# Patient Record
Sex: Female | Born: 1975 | Race: Black or African American | Hispanic: No | Marital: Single | Smoking: Never smoker
Health system: Southern US, Community
[De-identification: ages and names within clinical notes are randomized; demographics above are authoritative.]

## PROBLEM LIST (undated history)

## (undated) DIAGNOSIS — G43909 Migraine, unspecified, not intractable, without status migrainosus: Secondary | ICD-10-CM

## (undated) DIAGNOSIS — M25561 Pain in right knee: Secondary | ICD-10-CM

## (undated) DIAGNOSIS — N200 Calculus of kidney: Secondary | ICD-10-CM

## (undated) HISTORY — DX: Migraine, unspecified, not intractable, without status migrainosus: G43.909

## (undated) HISTORY — DX: Pain in right knee: M25.561

## (undated) HISTORY — PX: TUBAL LIGATION: SHX77

## (undated) HISTORY — DX: Calculus of kidney: N20.0

---

## 1998-10-06 HISTORY — PX: CHOLECYSTECTOMY: SHX55

## 1998-10-06 HISTORY — PX: KIDNEY STONE SURGERY: SHX686

## 1998-10-06 HISTORY — PX: APPENDECTOMY: SHX54

## 2003-11-29 ENCOUNTER — Emergency Department (HOSPITAL_COMMUNITY): Admission: EM | Admit: 2003-11-29 | Discharge: 2003-11-29 | Payer: Self-pay | Admitting: Family Medicine

## 2007-08-17 ENCOUNTER — Emergency Department (HOSPITAL_COMMUNITY): Admission: EM | Admit: 2007-08-17 | Discharge: 2007-08-17 | Payer: Self-pay | Admitting: Emergency Medicine

## 2007-10-18 ENCOUNTER — Emergency Department (HOSPITAL_COMMUNITY): Admission: EM | Admit: 2007-10-18 | Discharge: 2007-10-18 | Payer: Self-pay | Admitting: Family Medicine

## 2007-11-12 ENCOUNTER — Ambulatory Visit: Payer: Self-pay | Admitting: Internal Medicine

## 2007-11-12 ENCOUNTER — Ambulatory Visit: Payer: Self-pay | Admitting: *Deleted

## 2007-11-12 LAB — CONVERTED CEMR LAB
ALT: 10 units/L (ref 0–35)
AST: 11 units/L (ref 0–37)
Alkaline Phosphatase: 57 units/L (ref 39–117)
BUN: 16 mg/dL (ref 6–23)
Basophils Absolute: 0 10*3/uL (ref 0.0–0.1)
Basophils Relative: 0 % (ref 0–1)
Calcium: 9.1 mg/dL (ref 8.4–10.5)
Chloride: 104 meq/L (ref 96–112)
Creatinine, Ser: 0.82 mg/dL (ref 0.40–1.20)
Eosinophils Absolute: 0.3 10*3/uL (ref 0.0–0.7)
HDL: 54 mg/dL (ref >39)
Hemoglobin: 12.7 g/dL (ref 12.0–15.0)
LDL Cholesterol: 111 mg/dL — ABNORMAL HIGH (ref 0–99)
MCHC: 32.8 g/dL (ref 30.0–36.0)
MCV: 90 fL (ref 78.0–100.0)
Monocytes Absolute: 0.5 10*3/uL (ref 0.1–1.0)
Monocytes Relative: 6 % (ref 3–12)
Neutro Abs: 3.6 10*3/uL (ref 1.7–7.7)
RDW: 13.9 % (ref 11.5–15.5)
Total Bilirubin: 0.4 mg/dL (ref 0.3–1.2)
Total CHOL/HDL Ratio: 3.6
VLDL: 30 mg/dL (ref 0–40)

## 2008-01-10 ENCOUNTER — Ambulatory Visit: Payer: Self-pay | Admitting: Internal Medicine

## 2008-01-10 ENCOUNTER — Encounter: Payer: Self-pay | Admitting: Family Medicine

## 2008-01-10 LAB — CONVERTED CEMR LAB
Chlamydia, DNA Probe: NEGATIVE
GC Probe Amp, Genital: NEGATIVE

## 2008-02-23 ENCOUNTER — Ambulatory Visit: Payer: Self-pay | Admitting: Internal Medicine

## 2008-06-19 ENCOUNTER — Emergency Department (HOSPITAL_COMMUNITY): Admission: EM | Admit: 2008-06-19 | Discharge: 2008-06-19 | Payer: Self-pay | Admitting: Emergency Medicine

## 2009-03-21 ENCOUNTER — Encounter: Payer: Self-pay | Admitting: Family Medicine

## 2009-03-21 ENCOUNTER — Ambulatory Visit: Payer: Self-pay | Admitting: Family Medicine

## 2009-03-26 ENCOUNTER — Emergency Department (HOSPITAL_COMMUNITY): Admission: EM | Admit: 2009-03-26 | Discharge: 2009-03-26 | Payer: Self-pay | Admitting: Family Medicine

## 2009-04-02 ENCOUNTER — Ambulatory Visit: Payer: Self-pay | Admitting: Internal Medicine

## 2009-05-21 ENCOUNTER — Ambulatory Visit: Payer: Self-pay | Admitting: Internal Medicine

## 2009-05-28 ENCOUNTER — Ambulatory Visit: Payer: Self-pay | Admitting: Family Medicine

## 2009-06-04 ENCOUNTER — Ambulatory Visit (HOSPITAL_COMMUNITY): Admission: RE | Admit: 2009-06-04 | Discharge: 2009-06-04 | Payer: Self-pay | Admitting: Family Medicine

## 2010-04-10 ENCOUNTER — Ambulatory Visit: Payer: Self-pay | Admitting: Internal Medicine

## 2010-04-10 LAB — CONVERTED CEMR LAB: CRP: 0.8 mg/dL — ABNORMAL HIGH (ref <0.6)

## 2010-05-02 ENCOUNTER — Ambulatory Visit: Payer: Self-pay | Admitting: Internal Medicine

## 2010-06-07 ENCOUNTER — Emergency Department (HOSPITAL_COMMUNITY): Admission: EM | Admit: 2010-06-07 | Discharge: 2010-06-07 | Payer: Self-pay | Admitting: Emergency Medicine

## 2010-12-27 ENCOUNTER — Other Ambulatory Visit: Payer: Self-pay | Admitting: Advanced Practice Midwife

## 2010-12-27 ENCOUNTER — Encounter: Payer: Self-pay | Admitting: Obstetrics & Gynecology

## 2010-12-27 ENCOUNTER — Encounter (INDEPENDENT_AMBULATORY_CARE_PROVIDER_SITE_OTHER): Payer: Managed Care, Other (non HMO) | Admitting: Advanced Practice Midwife

## 2010-12-27 DIAGNOSIS — N390 Urinary tract infection, site not specified: Secondary | ICD-10-CM

## 2010-12-27 DIAGNOSIS — Z87891 Personal history of nicotine dependence: Secondary | ICD-10-CM

## 2010-12-27 DIAGNOSIS — N3090 Cystitis, unspecified without hematuria: Secondary | ICD-10-CM

## 2010-12-27 LAB — POCT URINALYSIS DIP (DEVICE)
Ketones, ur: NEGATIVE mg/dL
Protein, ur: NEGATIVE mg/dL
Specific Gravity, Urine: >=1.03 (ref 1.005–1.030)
Urobilinogen, UA: 0.2 mg/dL (ref 0.0–1.0)
pH: 5 (ref 5.0–8.0)

## 2010-12-28 ENCOUNTER — Encounter: Payer: Self-pay | Admitting: Obstetrics & Gynecology

## 2010-12-28 LAB — CONVERTED CEMR LAB
WBC, Wet Prep HPF POC: NONE SEEN
Yeast Wet Prep HPF POC: NONE SEEN

## 2010-12-28 NOTE — Progress Notes (Signed)
Tanya Page, Tanya Page               ACCOUNT NO.:  [id number]  MEDICAL RECORD NO.:  [id number]           PATIENT TYPE:  A  LOCATION:  [facility]                   FACILITY:  [facility]  PHYSICIAN:  Ellery Plunk, MD     DATE OF BIRTH:  1975/11/12  DATE OF SERVICE:  12/27/2010                                 CLINIC NOTE  REASON FOR VISIT:  To establish gynecologic care as well as for dysuria. The patient presents today to establish gynecologic care this clinic. She usually goes to [facility] where she had her last Pap smear. However, she had a very positive things about this clinic and wanted to come here.  Her last Pap smear was performed in July 2011 and we have that result here today.  Pap smear was normal; however, did show Trichomonas, which the patient was treated for.  The patient ended her relationship with her partner at that time and has not had any sexual partners since then.  Today, she denies any discharge, itching, or odors.  However, she does note irritation when she urinates, which is intermittent, and this has been going on for the last week.  The patient has no known drug allergies.  CURRENT MEDICATIONS: 1. Flexeril 10 mg. 2. Naproxen 500 mg.  Her last exam was 3 months ago - that her LMP was December 06, 2010.  She began periods at age 46 and they are regular.  She had a tubal ligation 10 years ago for contraception.  She is a G2, P2-0-0-1 with one child dying at the age of 6 years.  The patient has a history of Trichomonas on July 2011 and was treated.  PAST MEDICAL HISTORY:  The patient notes that she has kidney problems, stomach ulcers, and IVF.  PAST SURGICAL HISTORY:  She has never had a blood transfusion.  She had a tubal ligation 10 years ago and her gallbladder and appendix removed in 2002.  SOCIAL HISTORY:  She works at [employer] for [employer].  She lives with her son and her sisters.  She smokes half pack a day for 15 years.  She denies alcohol or  drug abuse.  FAMILY HISTORY:  The patient denies any significant medical history including diabetes, hypertension, cancer.  REVIEW OF SYSTEMS:  The patient reports some headache, muscle or joint pain and hot flashes as well as some frequent urination and some urinary incontinence.  PHYSICAL EXAMINATION:  VITAL SIGNS:  Temperature 98.5, pulse 96, blood pressure 102/71, weight 157.4, and height 66 inches. GENERAL:  She is in no acute distress.  Resting comfortably on exam table. HEENT:  Moist mucous membranes.  No scleral icterus. NECK:  Supple.  No thyromegaly or masses.  No cervical lymphadenopathy. HEART:  Regular rate and rhythm.  No murmur. LUNGS:  Clear to auscultation bilaterally. ABDOMEN:  Soft and nontender.  Bowel sounds positive.  No masses or organomegaly. GYNECOLOGIC:  External exam normal with normal external genitalia.  No lesions.  Vagina is normal with some minimal white discharge.  Normal rugae.  Cervix is normal color with no discharge or lesions.  No cervical motion tenderness.  Uterus appears to be  retroflexed and ovaries are not palpated due to body habitus.  ASSESSMENT AND PLAN:  This is a 35 year old who presents today to establish care with Gynecology as well as for concern for dysuria.  PLAN: 1. Today performed a complete physical exam as well as gynecologic     exam and wet prep was obtained to confirm treatment of Trichomonas.     The patient will be due for her Pap on April 11, 2011.  The patient     was asked to schedule visit for this time for her Pap smear. 2. Dysuria.  The patient's UA today did show positive nitrite, small     leuk esterase, and trace blood.  We will treat this presumed UTI     with Keflex and sent her urine for culture.  The patient is to     return in July 2012 for her annual Pap smear. 3. Primary care.  The patient was encouraged to either return to     [facility] for primary care or find another physician that she     likes.   She is a smoker, and I encouraged her to seek treatment and     workup smoking cessation.          ______________________________ Ellery Plunk, MD    RS/MEDQ  D:  12/27/2010  T:  12/28/2010  Job:  [id number]

## 2011-01-13 LAB — DIFFERENTIAL
Eosinophils Relative: 3 % (ref 0–5)
Lymphocytes Relative: 54 % — ABNORMAL HIGH (ref 12–46)
Lymphs Abs: 5 10*3/uL — ABNORMAL HIGH (ref 0.7–4.0)
Monocytes Relative: 5 % (ref 3–12)
Neutrophils Relative %: 38 % — ABNORMAL LOW (ref 43–77)

## 2011-01-13 LAB — CBC
HCT: 36.3 % (ref 36.0–46.0)
MCV: 90.9 fL (ref 78.0–100.0)
Platelets: 301 10*3/uL (ref 150–400)
RBC: 3.99 MIL/uL (ref 3.87–5.11)
WBC: 9.2 10*3/uL (ref 4.0–10.5)

## 2011-01-13 LAB — D-DIMER, QUANTITATIVE: D-Dimer, Quant: <0.22 ug/mL-FEU (ref 0.00–0.48)

## 2011-01-24 ENCOUNTER — Emergency Department (HOSPITAL_COMMUNITY)
Admission: EM | Admit: 2011-01-24 | Discharge: 2011-01-24 | Disposition: A | Discharge status: Home / Self Care | Payer: Managed Care, Other (non HMO) | Attending: Emergency Medicine | Admitting: Emergency Medicine

## 2011-01-24 DIAGNOSIS — T192XXA Foreign body in vulva and vagina, initial encounter: Secondary | ICD-10-CM | POA: Insufficient documentation

## 2011-01-24 DIAGNOSIS — Y929 Unspecified place or not applicable: Secondary | ICD-10-CM | POA: Insufficient documentation

## 2011-01-24 DIAGNOSIS — IMO0002 Reserved for concepts with insufficient information to code with codable children: Secondary | ICD-10-CM | POA: Insufficient documentation

## 2011-06-01 ENCOUNTER — Emergency Department (HOSPITAL_COMMUNITY)
Admission: EM | Admit: 2011-06-01 | Discharge: 2011-06-01 | Disposition: A | Discharge status: Home / Self Care | Payer: Managed Care, Other (non HMO) | Attending: Emergency Medicine | Admitting: Emergency Medicine

## 2011-06-01 DIAGNOSIS — G43909 Migraine, unspecified, not intractable, without status migrainosus: Secondary | ICD-10-CM | POA: Insufficient documentation

## 2011-06-01 DIAGNOSIS — M25569 Pain in unspecified knee: Secondary | ICD-10-CM | POA: Insufficient documentation

## 2011-06-13 ENCOUNTER — Other Ambulatory Visit (HOSPITAL_COMMUNITY)
Admission: RE | Admit: 2011-06-13 | Discharge: 2011-06-13 | Disposition: A | Discharge status: Home / Self Care | Payer: Managed Care, Other (non HMO) | Source: Ambulatory Visit | Attending: Family Medicine | Admitting: Family Medicine

## 2011-06-13 ENCOUNTER — Ambulatory Visit (INDEPENDENT_AMBULATORY_CARE_PROVIDER_SITE_OTHER): Payer: Managed Care, Other (non HMO) | Admitting: Family Medicine

## 2011-06-13 ENCOUNTER — Encounter: Payer: Self-pay | Admitting: Family Medicine

## 2011-06-13 VITALS — BP 120/80 | HR 72 | Temp 98.2°F | Ht 65.75 in | Wt 171.0 lb

## 2011-06-13 DIAGNOSIS — Z9851 Tubal ligation status: Secondary | ICD-10-CM

## 2011-06-13 DIAGNOSIS — G5600 Carpal tunnel syndrome, unspecified upper limb: Secondary | ICD-10-CM

## 2011-06-13 DIAGNOSIS — G43909 Migraine, unspecified, not intractable, without status migrainosus: Secondary | ICD-10-CM

## 2011-06-13 DIAGNOSIS — Z0000 Encounter for general adult medical examination without abnormal findings: Secondary | ICD-10-CM

## 2011-06-13 DIAGNOSIS — Z01419 Encounter for gynecological examination (general) (routine) without abnormal findings: Secondary | ICD-10-CM | POA: Insufficient documentation

## 2011-06-13 LAB — TSH: TSH: 1.1 u[IU]/mL (ref 0.35–5.50)

## 2011-06-13 LAB — POCT URINALYSIS DIPSTICK
Bilirubin, UA: NEGATIVE
Glucose, UA: NEGATIVE
Ketones, UA: NEGATIVE
Leukocytes, UA: NEGATIVE
Spec Grav, UA: 1.02

## 2011-06-13 LAB — CBC WITH DIFFERENTIAL/PLATELET
Basophils Absolute: 0 10*3/uL (ref 0.0–0.1)
Basophils Relative: 0.3 % (ref 0.0–3.0)
Eosinophils Relative: 5.6 % — ABNORMAL HIGH (ref 0.0–5.0)
Hemoglobin: 12.1 g/dL (ref 12.0–15.0)
Lymphocytes Relative: 52 % — ABNORMAL HIGH (ref 12.0–46.0)
Monocytes Relative: 6.3 % (ref 3.0–12.0)
Neutro Abs: 2.1 10*3/uL (ref 1.4–7.7)
RBC: 4.08 Mil/uL (ref 3.87–5.11)
RDW: 13.1 % (ref 11.5–14.6)
WBC: 5.9 10*3/uL (ref 4.5–10.5)

## 2011-06-13 LAB — LIPID PANEL
Cholesterol: 201 mg/dL — ABNORMAL HIGH (ref 0–200)
HDL: 54.3 mg/dL (ref >39.00)
Total CHOL/HDL Ratio: 4
VLDL: 14.6 mg/dL (ref 0.0–40.0)

## 2011-06-13 LAB — BASIC METABOLIC PANEL
BUN: 16 mg/dL (ref 6–23)
Creatinine, Ser: 0.8 mg/dL (ref 0.4–1.2)
GFR: 111.95 mL/min (ref >60.00)

## 2011-06-13 LAB — HEPATIC FUNCTION PANEL
Bilirubin, Direct: 0 mg/dL (ref 0.0–0.3)
Total Bilirubin: 0.8 mg/dL (ref 0.3–1.2)

## 2011-06-13 MED ORDER — TEMAZEPAM 30 MG PO CAPS: 30.0000 mg | ORAL_CAPSULE | Freq: Every evening | ORAL | Status: AC | PRN

## 2011-06-13 MED ORDER — RIZATRIPTAN BENZOATE 10 MG PO TABS: 10.0000 mg | ORAL_TABLET | Freq: Once | ORAL | Status: DC | PRN

## 2011-06-13 NOTE — Progress Notes (Signed)
Subjective:    Patient ID: Tanya Page, female    DOB: 12-09-1975, 35 y.o.   MRN: [id number]  HPI 35 yr old female to establish with [place] and for a cpx. She moved to [place] from [place], [place] about a year ago. Her last cpx and Pap were done in June 2011. She has several issues to discuss. First she fell almost a year ago and injured her right knee, and it sounds like she has been diagnosed with patellofemoral syndrome. Her patella subluxates laterally frequently and causes her a lot of pain. She has had 3 months of PT, been fitted with braces, and has had several cortisone injections, but nothing seems to help much. She is seeing [facility] for this. She also has migraine HAs frequently, often having 3-4 a month. She has severe HAs about 3-4 times a year. She has tried Imitrex shots twice, but both times she had chest pains afterwards, and she has never tried any type of oral triptan since then. She uses Excedrin Migraine or Advil and goes to bed with the more severe HAs. She gets nauseated but does not vomit with them. Third, she mentions occasional swelling and numbness in the right hand, but no pain. This started about 3 months ago. 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 Last, she had a tubal ligation in 2000 in [place], [place], and she asks about the possibility of having this reversed.   Review of Systems  Constitutional: Negative.  Negative for fever, diaphoresis, activity change, appetite change, fatigue and unexpected weight change.  HENT: Negative for hearing loss, ear pain, nosebleeds, congestion, sore throat, trouble swallowing, neck pain, neck stiffness, voice change and tinnitus.   Eyes: Negative.  Negative for photophobia, pain, discharge, redness and visual disturbance.  Respiratory: Negative.  Negative for apnea, cough, choking, chest tightness, shortness of breath, wheezing and stridor.   Cardiovascular: Negative.  Negative for chest pain,  palpitations and leg swelling.  Gastrointestinal: Negative.  Negative for nausea, vomiting, abdominal pain, diarrhea, constipation, blood in stool, abdominal distention and rectal pain.  Genitourinary: Negative.  Negative for dysuria, urgency, frequency, hematuria, flank pain, vaginal bleeding, vaginal discharge, enuresis, difficulty urinating, vaginal pain and menstrual problem.  Musculoskeletal: Negative.  Negative for myalgias, back pain, joint swelling, arthralgias and gait problem.  Skin: Negative.  Negative for color change, pallor, rash and wound.  Neurological: Positive for numbness and headaches. Negative for dizziness, tremors, seizures, syncope, speech difficulty, weakness and light-headedness.  Hematological: Negative.  Negative for adenopathy. Does not bruise/bleed easily.  Psychiatric/Behavioral: Negative.  Negative for hallucinations, behavioral problems, confusion, sleep disturbance, dysphoric mood and agitation. The patient is not nervous/anxious.        Objective:   Physical Exam  Constitutional: She appears well-developed and well-nourished. No distress.  HENT:  Head: Normocephalic and atraumatic.  Right Ear: External ear normal.  Left Ear: External ear normal.  Nose: Nose normal.  Mouth/Throat: Oropharynx is clear and moist. No oropharyngeal exudate.  Eyes: Conjunctivae and EOM are normal. Pupils are equal, round, and reactive to light. Right eye exhibits no discharge. Left eye exhibits no discharge. No scleral icterus.  Neck: Normal range of motion. Neck supple. No JVD present. No thyromegaly present.  Cardiovascular: Normal rate, regular rhythm, normal heart sounds and intact distal pulses.  Exam reveals no gallop and no friction rub.   No murmur heard. Pulmonary/Chest: Effort normal and breath sounds normal. No stridor. No respiratory distress. She has no wheezes.  She has no rales. She exhibits no tenderness.  Abdominal: Soft. Normal appearance and bowel sounds are  normal. She exhibits no distension, no abdominal bruit, no ascites and no mass. There is no hepatosplenomegaly. There is no tenderness. There is no rigidity, no rebound and no guarding. No hernia.  Genitourinary: Rectum normal, vagina normal and uterus normal. No breast swelling, tenderness, discharge or bleeding. Cervix exhibits no motion tenderness, no discharge and no friability. Right adnexum displays no mass, no tenderness and no fullness. Left adnexum displays no mass, no tenderness and no fullness. No erythema, tenderness or bleeding around the vagina. No vaginal discharge found.  Musculoskeletal: Normal range of motion. She exhibits no edema and no tenderness.  Lymphadenopathy:    She has no cervical adenopathy.  Neurological: She is alert. She has normal reflexes. No cranial nerve deficit. She exhibits normal muscle tone. Coordination normal.  Skin: Skin is warm and dry. No rash noted. She is not diaphoretic. No erythema. No pallor.  Psychiatric: She has a normal mood and affect. Her behavior is normal. Judgment and thought content normal.          Assessment & Plan:  Well exam. Advised her to set up her first baseline mammogram soon. Try Maxalt for her migraines. Wear a wrist splint for the right hand. We will refer her to GYN to discuss a tubal reversal

## 2011-06-18 ENCOUNTER — Telehealth: Payer: Self-pay | Admitting: Family Medicine

## 2011-06-18 NOTE — Telephone Encounter (Signed)
Message copied by Baldemar Friday on Wed Jun 18, 2011  5:29 PM ------      Message from: Gershon Crane A      Created: Tue Jun 17, 2011  5:25 AM       Normal except high chol. Watch the diet

## 2011-06-18 NOTE — Telephone Encounter (Signed)
Spoke with pt and gave results. 

## 2011-06-25 NOTE — Progress Notes (Signed)
Quick Note:  Pt aware ______ 

## 2011-06-25 NOTE — Progress Notes (Signed)
Quick Note:  Left a message for pt to return call. ______ 

## 2011-07-11 ENCOUNTER — Ambulatory Visit (HOSPITAL_BASED_OUTPATIENT_CLINIC_OR_DEPARTMENT_OTHER)
Admission: RE | Admit: 2011-07-11 | Discharge: 2011-07-11 | Disposition: A | Discharge status: Home / Self Care | Payer: Managed Care, Other (non HMO) | Source: Ambulatory Visit | Attending: Orthopedic Surgery | Admitting: Orthopedic Surgery

## 2011-07-11 DIAGNOSIS — M6750 Plica syndrome, unspecified knee: Secondary | ICD-10-CM | POA: Insufficient documentation

## 2011-07-11 DIAGNOSIS — M2240 Chondromalacia patellae, unspecified knee: Secondary | ICD-10-CM | POA: Insufficient documentation

## 2011-07-11 DIAGNOSIS — Z01812 Encounter for preprocedural laboratory examination: Secondary | ICD-10-CM | POA: Insufficient documentation

## 2011-07-11 DIAGNOSIS — M6240 Contracture of muscle, unspecified site: Secondary | ICD-10-CM | POA: Insufficient documentation

## 2011-07-11 HISTORY — PX: KNEE ARTHROSCOPY: SUR90

## 2011-07-14 NOTE — Op Note (Signed)
NAMEJERUSALEN, Tanya Page               ACCOUNT NO.:  [id number]  MEDICAL RECORD NO.:  [id number]  LOCATION:                                 FACILITY:  PHYSICIAN:  Harvie Junior, M.D.   DATE OF BIRTH:  [birth year]  DATE OF PROCEDURE:  07/11/2011 DATE OF DISCHARGE:                              OPERATIVE REPORT   PREOPERATIVE DIAGNOSIS:  Right knee pain with chondromalacia patella.  POSTOPERATIVE DIAGNOSES: 1. 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 2. Medial shelf plica.  PROCEDURE: 1. Arthroscopic lateral retinacular release. 2. Arthroscopic debridement of medial plica coming all the way down to     the anterior area.  SURGEON:  Harvie Junior, MD  ASSISTANT:  Marshia Ly, PA  ANESTHESIA:  General.  BRIEF HISTORY:  Tanya Page is a 35 year old female with a long history of significant right knee pain.  We treated conservatively for a long period of time.  She failed activity modification, injection therapy, anti-inflammatory medication because of failure of all conservative care.  She was also taken to the operating room for operative knee arthroscopy, suspected that most of her pain was patellofemoral in origin preoperatively, and we felt that we wanted to take a good look at patellar tracking and give consideration to lateral retinacular release if her tracking seemed to be not midline.  She was brought to the operating room for this procedure.  PROCEDURE IN DETAIL:  The patient was brought to the operating room. After adequate anesthesia was obtained with general anesthetic, the patient was placed supine on the operating table.  The right leg was prepped and draped in usual sterile fashion.  Following this routine arthroscopic examination of the knee revealed there was an obvious lateral patellar tracking with both lateral tilt and lateral translation, went to the medial side.  There was a large medial shelf plica, which  connected around anteriorly to a sort of an anterior plica within the medial compartment, medial meniscus normal, medial femoral condyle normal, ACL normal, lateral side.  No little bit of an outer rim of meniscal issue, but certainly nothing that needed to be debrided and lateral femoral condyle normal.  Attention turned back up to the lateral retinaculum, which was densely tight and a lateral retinacular release was performed from the three fingerbreadths proximal to the patella down to the joint line.  Probe was used.  Once this done to make sure, we did in fact released entirely the lateral retinaculum.  At this time, the lateral patellar tracking completely resolved itself in terms of its tilt and once that was completed, attention turned medially where this large medial plica was debrided from the medial side down around anteriorly and into the notch.  Once this was completed, the knee was again examined mediolaterally ACL and up into the patellofemoral joint. No evidence of a looser fragment and pieces and at this point, the knee was copiously and thoroughly lavaged, suctioned dry.  All arthroscopic portals were closed with bandage.  Sterile compression dressing was applied.  The patient was taken to the recovery room.  She was noted to be in satisfactory condition.  Estimated  blood loss for this procedure was none.     Harvie Junior, M.D.     Ranae Plumber  D:  07/11/2011  T:  07/12/2011  Job:  [id number]  Electronically Signed by Jodi Geralds M.D. on 07/14/2011 05:15:19 PM

## 2011-08-11 ENCOUNTER — Telehealth: Payer: Self-pay | Admitting: *Deleted

## 2011-08-11 NOTE — Telephone Encounter (Signed)
Since having her CPX , she has had one period that has been 2 weeks long??  Suggestions?

## 2011-08-12 ENCOUNTER — Telehealth: Payer: Self-pay | Admitting: Family Medicine

## 2011-08-12 NOTE — Telephone Encounter (Signed)
I spoke with pt and she is going to schedule a office visit for this week.

## 2011-08-12 NOTE — Telephone Encounter (Signed)
I do not think she needs to worry about this. Besides I thought she was going to see GYN anyway about a tubal reversal. She could ask them if the periods are still like this

## 2011-08-12 NOTE — Telephone Encounter (Signed)
Pt called back and stated that she never scheduled a GYN appt. She stated that she had changed her mind about having the tubal reversal. Had knee surgery on 07-11-2011 and period started 07-26-2011, and as of today she is still bleeding. Please advise. Thanks. Please call patient at [phone].  [facility].

## 2011-08-13 ENCOUNTER — Ambulatory Visit (INDEPENDENT_AMBULATORY_CARE_PROVIDER_SITE_OTHER): Payer: Managed Care, Other (non HMO) | Admitting: Family Medicine

## 2011-08-13 ENCOUNTER — Encounter: Payer: Self-pay | Admitting: Family Medicine

## 2011-08-13 VITALS — BP 112/76 | HR 90 | Temp 98.3°F | Wt 175.0 lb

## 2011-08-13 DIAGNOSIS — N921 Excessive and frequent menstruation with irregular cycle: Secondary | ICD-10-CM

## 2011-08-13 NOTE — Progress Notes (Signed)
  Subjective:    Patient ID: Tanya Page, female    DOB: 10-19-1975, 35 y.o.   MRN: [id number]  HPI Here for heavy menses for the past few months. She was here on 06-13-11 for a cpx, and her pelvic exam and Pap smear were normal. Her menses had been unremarkable to that point. However in September she had bleeding for 2 weeks, and then in October she had bleeding for 3 weeks. Her cramps are light. No other problems. No recent medication changes. She has had a tubal ligation.    Review of Systems  Constitutional: Negative.   Gastrointestinal: Negative.   Genitourinary: Positive for menstrual problem.       Objective:   Physical Exam  Constitutional: She appears well-developed and well-nourished.  Abdominal: Soft. Bowel sounds are normal. She exhibits no distension and no mass. There is no tenderness. There is no rebound and no guarding.          Assessment & Plan:  Get a pelvic US to look at her endometrial lining, and we will go from there

## 2011-08-14 NOTE — Progress Notes (Signed)
Addended by: Gershon Crane A on: 08/14/2011 09:34 AM   Modules accepted: Orders

## 2011-08-15 ENCOUNTER — Ambulatory Visit: Payer: Managed Care, Other (non HMO) | Admitting: Family Medicine

## 2011-08-19 ENCOUNTER — Ambulatory Visit
Admission: RE | Admit: 2011-08-19 | Discharge: 2011-08-19 | Disposition: A | Discharge status: Home / Self Care | Payer: Managed Care, Other (non HMO) | Source: Ambulatory Visit | Attending: Family Medicine | Admitting: Family Medicine

## 2011-08-19 DIAGNOSIS — N921 Excessive and frequent menstruation with irregular cycle: Secondary | ICD-10-CM

## 2011-08-20 ENCOUNTER — Telehealth: Payer: Self-pay | Admitting: Family Medicine

## 2011-08-20 DIAGNOSIS — N921 Excessive and frequent menstruation with irregular cycle: Secondary | ICD-10-CM

## 2011-08-20 NOTE — Progress Notes (Signed)
Quick Note:  Spoke with pt and she does want the referral, no one in particular. ______

## 2011-08-20 NOTE — Telephone Encounter (Signed)
Pt does want the referral for the GYN, no one in particular.

## 2011-08-21 NOTE — Telephone Encounter (Signed)
Pt is aware of referral

## 2011-08-21 NOTE — Telephone Encounter (Signed)
The referral is done. Camelia Eng will call her

## 2011-09-01 ENCOUNTER — Encounter (HOSPITAL_COMMUNITY): Payer: Self-pay | Admitting: *Deleted

## 2011-09-01 ENCOUNTER — Inpatient Hospital Stay (HOSPITAL_COMMUNITY)
Admission: AD | Admit: 2011-09-01 | Discharge: 2011-09-01 | Disposition: A | Discharge status: Home / Self Care | Payer: Managed Care, Other (non HMO) | Source: Ambulatory Visit | Attending: Obstetrics & Gynecology | Admitting: Obstetrics & Gynecology

## 2011-09-01 DIAGNOSIS — N938 Other specified abnormal uterine and vaginal bleeding: Secondary | ICD-10-CM | POA: Insufficient documentation

## 2011-09-01 DIAGNOSIS — N949 Unspecified condition associated with female genital organs and menstrual cycle: Secondary | ICD-10-CM | POA: Insufficient documentation

## 2011-09-01 LAB — CBC
HCT: 34.3 % — ABNORMAL LOW (ref 36.0–46.0)
MCV: 89.3 fL (ref 78.0–100.0)
Platelets: 323 10*3/uL (ref 150–400)
RBC: 3.84 MIL/uL — ABNORMAL LOW (ref 3.87–5.11)
WBC: 8.3 10*3/uL (ref 4.0–10.5)

## 2011-09-01 LAB — URINALYSIS, ROUTINE W REFLEX MICROSCOPIC
Bilirubin Urine: NEGATIVE
Glucose, UA: NEGATIVE mg/dL
Nitrite: NEGATIVE
Specific Gravity, Urine: >1.03 — ABNORMAL HIGH (ref 1.005–1.030)
pH: 5.5 (ref 5.0–8.0)

## 2011-09-01 LAB — BASIC METABOLIC PANEL
CO2: 25 mEq/L (ref 19–32)
Chloride: 104 mEq/L (ref 96–112)
Creatinine, Ser: 0.81 mg/dL (ref 0.50–1.10)

## 2011-09-01 LAB — URINE MICROSCOPIC-ADD ON

## 2011-09-01 MED ORDER — MEDROXYPROGESTERONE ACETATE 10 MG PO TABS: 20.0000 mg | ORAL_TABLET | Freq: Every day | ORAL | Status: DC

## 2011-09-01 NOTE — ED Provider Notes (Signed)
History   Chief Complaint:  Vaginal Bleeding   Tanya Page is  35 y.o. G2P1101 Patient's last menstrual period was 07/26/2011. Her pregnancy status is negative.   She reports vag bleeding since 07/26/2011. Pt having dizziness, blurry vision and cramping.  She uses about 6 pads/ day.     Pertinent Gynecological History: Menses: flow is excessive with use of 6 pads or tampons on heaviest days Bleeding: dysfunctional uterine bleeding Contraception: none DES exposure: denies Blood transfusions: none Last pap: normal Date:06/13/11   Past Medical History  Diagnosis Date  . Migraines   . Nephrolithiasis   . Right knee pain     Past Surgical History  Procedure Date  . Tubal ligation   . Appendectomy 2000  . Cholecystectomy 2000  . Kidney stone surgery 2000  . Knee arthroscopy 07-11-11    right knee, per Dr. Milly Jakob     Family History  Problem Relation Age of Onset  . Hypertension Mother     History  Substance Use Topics  . Smoking status: Never Smoker   . Smokeless tobacco: Never Used  . Alcohol Use: 0.6 oz/week    1 Glasses of wine per week    Allergies:  Allergies  Allergen Reactions  . Imitrex (Sumatriptan Base)     Shortness of breath and chest tightness  . Percocet (Oxycodone-Acetaminophen)     Prescriptions prior to admission  Medication Sig Dispense Refill  . amitriptyline (ELAVIL) 25 MG tablet Take 25 mg by mouth at bedtime.        Marland Kitchen HYDROcodone-acetaminophen (NORCO) 5-325 MG per tablet Take 1 tablet by mouth every 6 (six) hours as needed.        . indomethacin (INDOCIN SR) 75 MG CR capsule Take 75 mg by mouth 2 (two) times daily with a meal.        . lidocaine (LIDODERM) 5 % Place 1 patch onto the skin every 12 (twelve) hours. Remove & Discard patch within 12 hours or as directed by MD       . meloxicam (MOBIC) 15 MG tablet Take 15 mg by mouth daily.        . promethazine (PHENERGAN) 12.5 MG tablet Take 12.5 mg by mouth every 6 (six) hours as  needed.           Physical Exam   Blood pressure 126/87, pulse 87, temperature 97.5 F (36.4 C), resp. rate 16, height 5\' 6"  (1.676 m), weight 80.831 kg (178 lb 3.2 oz), last menstrual period 07/26/2011, SpO2 96.00%. General: Mental status - alert, oriented to person, place, and time Abdomen - soft, nontender, nondistended, no masses or organomegaly Pelvic - examination not indicated  Labs: Recent Results (from the past 24 hour(s))  URINALYSIS, ROUTINE W REFLEX MICROSCOPIC   Collection Time   09/01/11  8:45 PM      Component Value Range   Color, Urine YELLOW  YELLOW    Appearance CLEAR  CLEAR    Specific Gravity, Urine >1.030 (*) 1.005 - 1.030    pH 5.5  5.0 - 8.0    Glucose, UA NEGATIVE  NEGATIVE (mg/dL)   Hgb urine dipstick LARGE (*) NEGATIVE    Bilirubin Urine NEGATIVE  NEGATIVE    Ketones, ur 15 (*) NEGATIVE (mg/dL)   Protein, ur NEGATIVE  NEGATIVE (mg/dL)   Urobilinogen, UA 0.2  0.0 - 1.0 (mg/dL)   Nitrite NEGATIVE  NEGATIVE    Leukocytes, UA NEGATIVE  NEGATIVE   URINE MICROSCOPIC-ADD ON  Collection Time   09/01/11  8:45 PM      Component Value Range   Squamous Epithelial / LPF RARE  RARE    WBC, UA 0-2  <3 (WBC/hpf)   RBC / HPF 3-6  <3 (RBC/hpf)   Bacteria, UA FEW (*) RARE   POCT PREGNANCY, URINE   Collection Time   09/01/11  9:04 PM      Component Value Range   Preg Test, Ur NEGATIVE    CBC   Collection Time   09/01/11  9:41 PM      Component Value Range   WBC 8.3  4.0 - 10.5 (K/uL)   RBC 3.84 (*) 3.87 - 5.11 (MIL/uL)   Hemoglobin 11.4 (*) 12.0 - 15.0 (g/dL)   HCT 16.1 (*) 09.6 - 46.0 (%)   MCV 89.3  78.0 - 100.0 (fL)   MCH 29.7  26.0 - 34.0 (pg)   MCHC 33.2  30.0 - 36.0 (g/dL)   RDW 04.5  40.9 - 81.1 (%)   Platelets 323  150 - 400 (K/uL)    Ultrasound Studies US Pelvis Complete  08/19/2011  *RADIOLOGY REPORT*  Clinical Data: Menorrhagia, pelvic pain  TRANSABDOMINAL AND TRANSVAGINAL ULTRASOUND OF PELVIS Technique:  Both transabdominal and  transvaginal ultrasound examinations of the pelvis were performed. Transabdominal technique was performed for global imaging of the pelvis including uterus, ovaries, adnexal regions, and pelvic cul-de-sac.  Comparison: None.   It was necessary to proceed with endovaginal exam following the transabdominal exam to visualize the endometrium and non-visualized right ovary.  Findings:  Uterus: 9.7 x 4.9 x 4.7 cm.  Anteverted, anteflexed.  No focal abnormality.  Endometrium: 5 mm, inhomogeneous, with suggestion of a focus of internal color flow but no measurable abnormality.  Right ovary:  3.4 x 2.3 x 1.4 cm.  Normal.  Left ovary: 4.3 x 4.4 x 3.1 cm.  Normal.  Other findings: No free fluid  IMPRESSION: Mildly inhomogeneous endometrium without measurable thickening, although there is suggestion of internal color flow that could suggest an underlying polyp, submucosal fibroid, or less likely hyperplasia/neoplasia.  Consider sonohysterography for further evaluation if symptoms persist.  No acute abnormality.  Original Report Authenticated By: Harrel Lemon, M.D.   Assessment: There is no problem list on file for this patient. Dysfunctional uterine bleeding Irregular endometrial lining  Plan: Needs to be seen in GYN clinic.  Provera Rx given; 20 mg po daily. Needs endometrial biopsy and discussion about medical vs surgical (polypectomy, endometrial ablation) Bleeding precautions reviewed.  Discharge Medications: Current Discharge Medication List    CONTINUE these medications which have NOT CHANGED   Details  amitriptyline (ELAVIL) 25 MG tablet Take 25 mg by mouth at bedtime.      HYDROcodone-acetaminophen (NORCO) 5-325 MG per tablet Take 1 tablet by mouth every 6 (six) hours as needed.      indomethacin (INDOCIN SR) 75 MG CR capsule Take 75 mg by mouth 2 (two) times daily with a meal.      lidocaine (LIDODERM) 5 % Place 1 patch onto the skin every 12 (twelve) hours. Remove & Discard patch within 12  hours or as directed by MD     meloxicam (MOBIC) 15 MG tablet Take 15 mg by mouth daily.      promethazine (PHENERGAN) 12.5 MG tablet Take 12.5 mg by mouth every 6 (six) hours as needed.           Jedadiah Abdallah A 09/01/2011, 10:10 PM

## 2011-09-01 NOTE — Progress Notes (Signed)
Pt reports vag bleeding since 07/26/2011.  Pt having dizziness, blurry vision and cramping.

## 2011-12-25 IMAGING — US US PELVIS COMPLETE
1 series · 14 of 25 positions shown · non-contrast
Comparison: None.

CLINICAL DATA: Menorrhagia, pelvic pain

TRANSABDOMINAL AND TRANSVAGINAL ULTRASOUND OF PELVIS
TECHNIQUE: Both transabdominal and transvaginal ultrasound
examinations of the pelvis were performed. Transabdominal technique
was performed for global imaging of the pelvis including uterus,
ovaries, adnexal regions, and pelvic cul-de-sac.

[Series 1: us pelvis complete · 0.33mm/px · 14 of 67 slices shown]
[im 1/67]
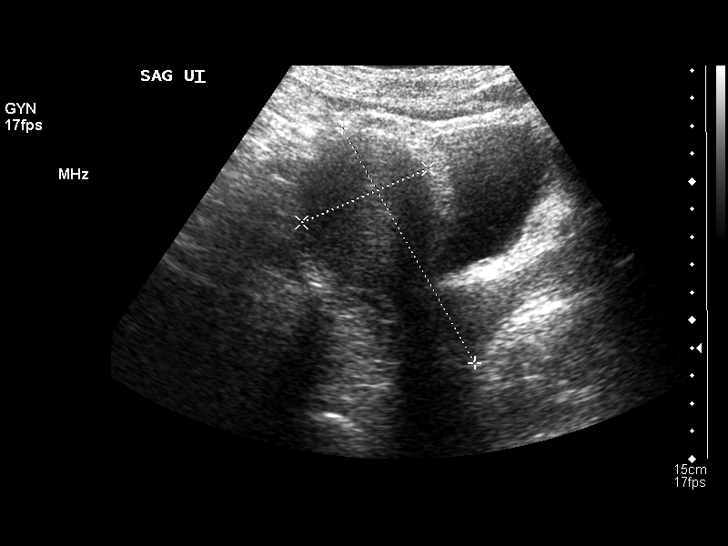
[im 6/67]
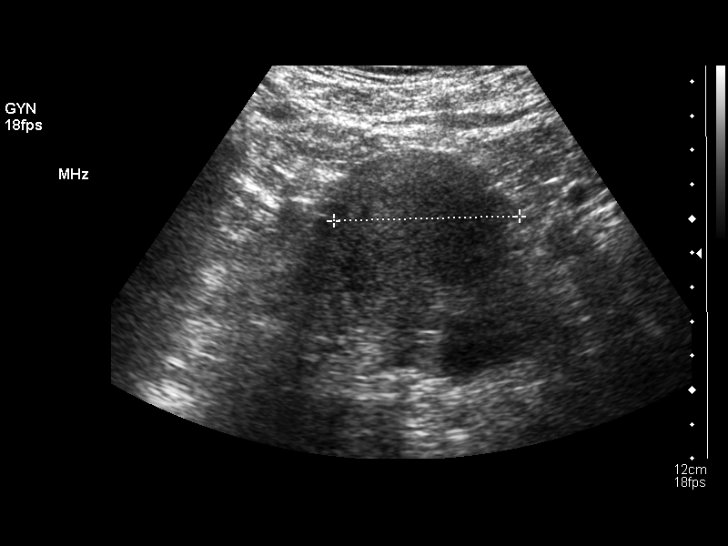
[im 12/67]
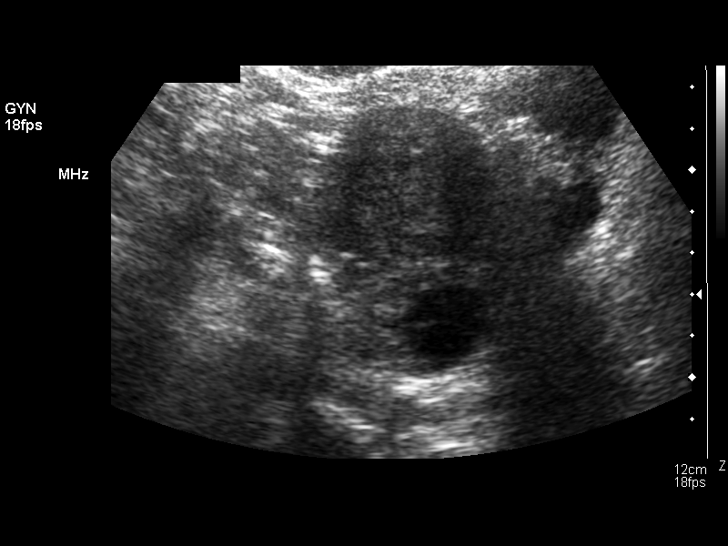
[im 17/67]
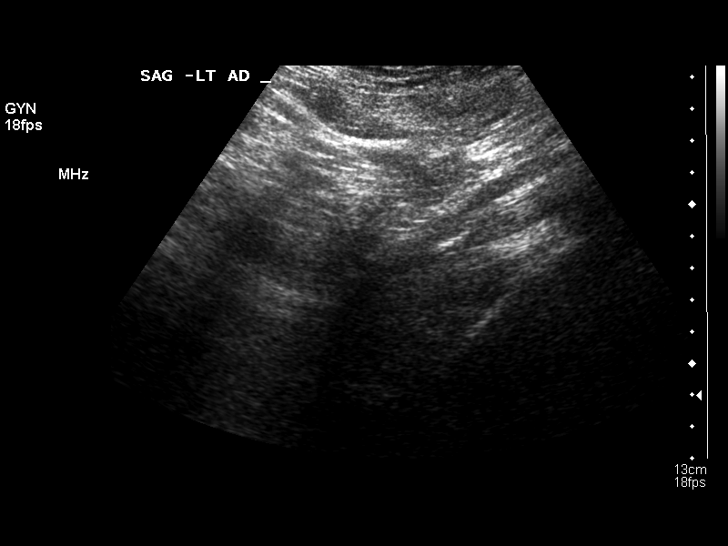
[im 23/67]
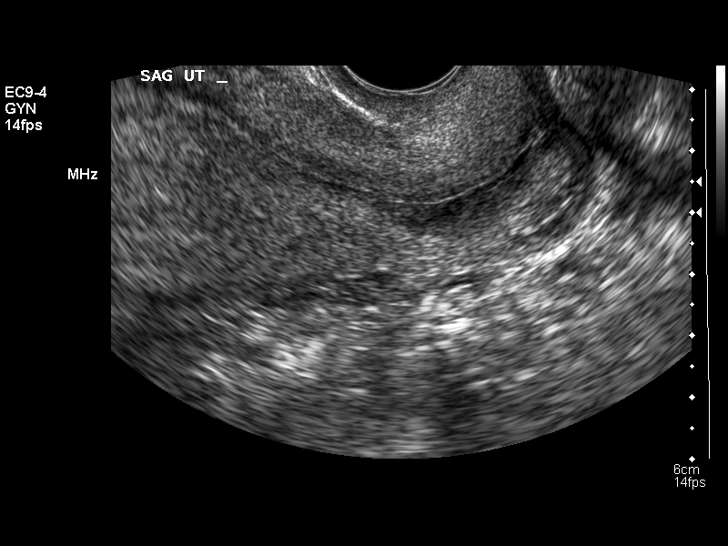
[im 25/67]
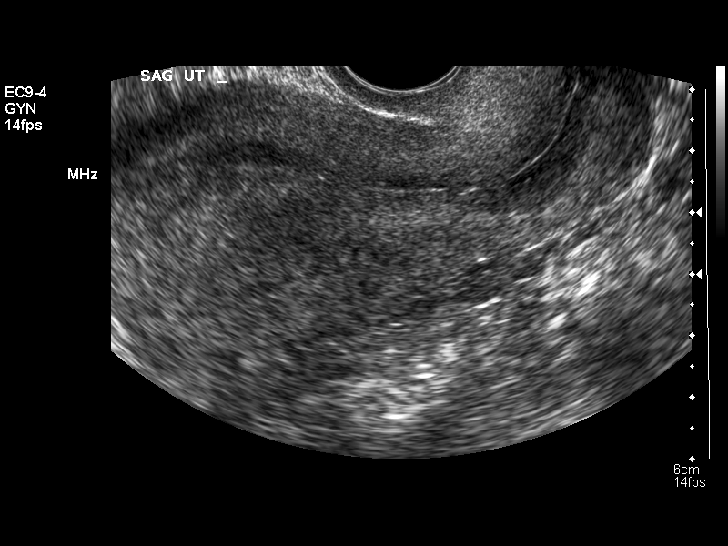
[im 31/67]
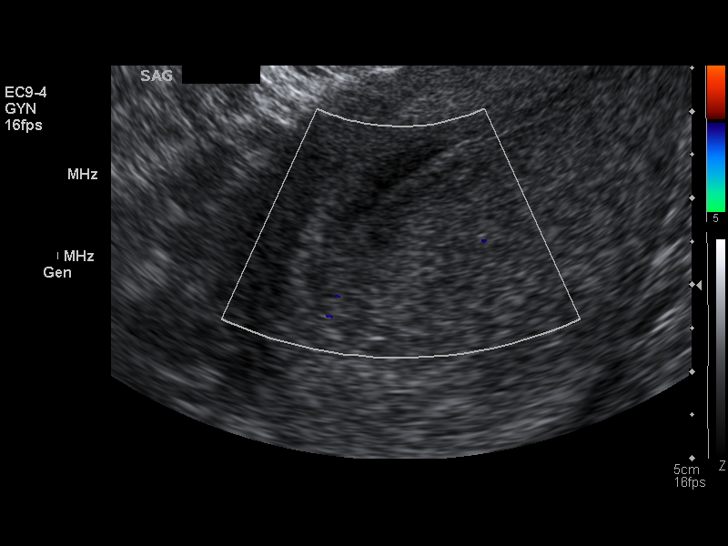
[im 36/67]
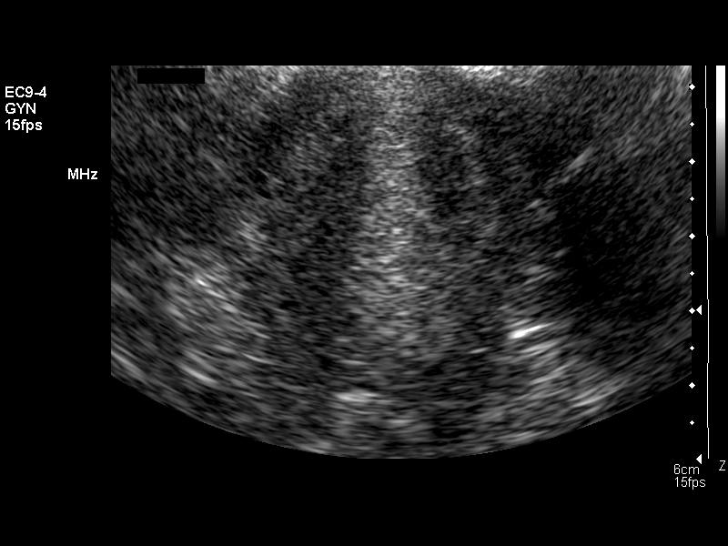
[im 42/67]
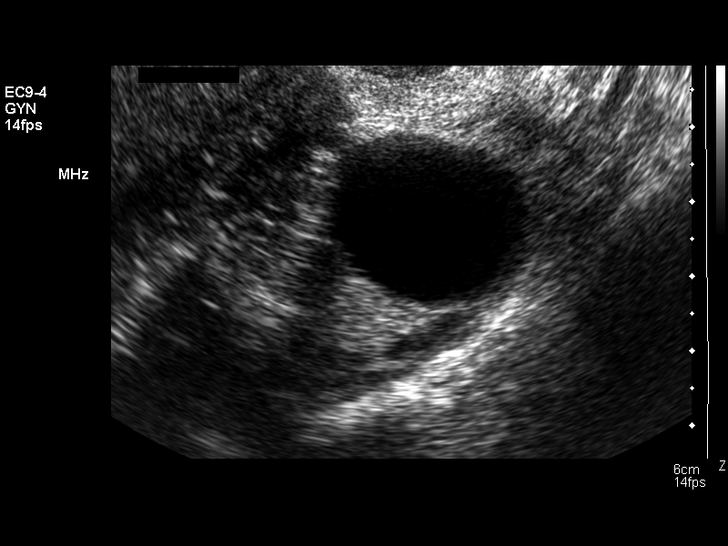
[im 45/67]
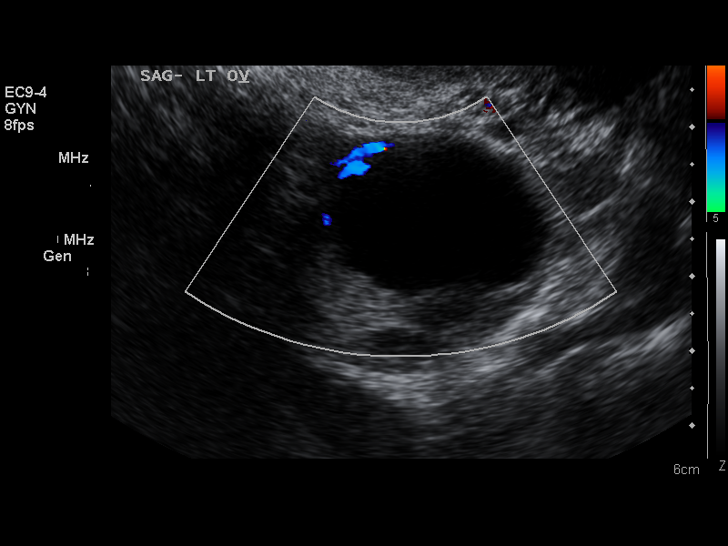
[im 50/67]
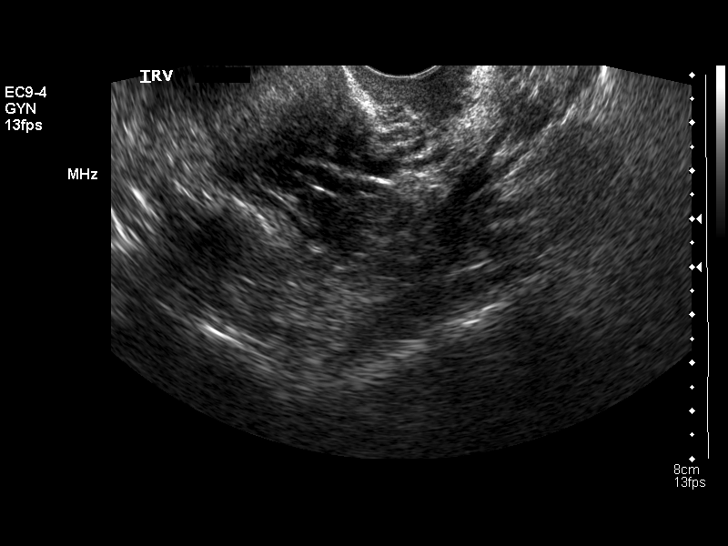
[im 56/67]
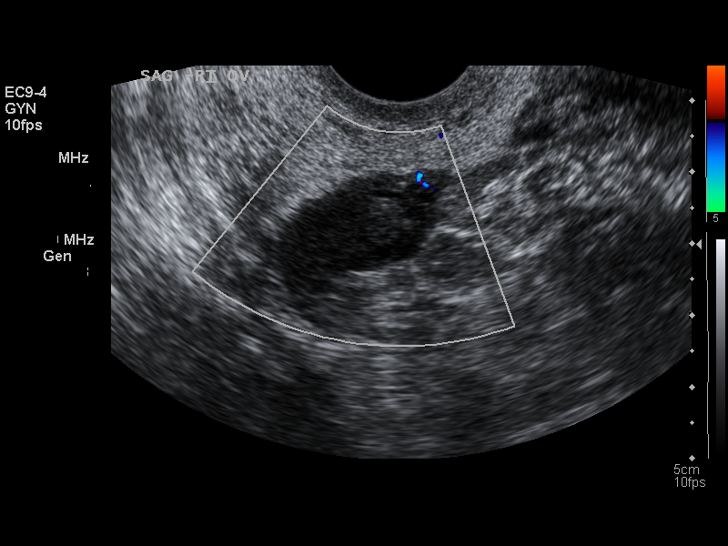
[im 61/67]
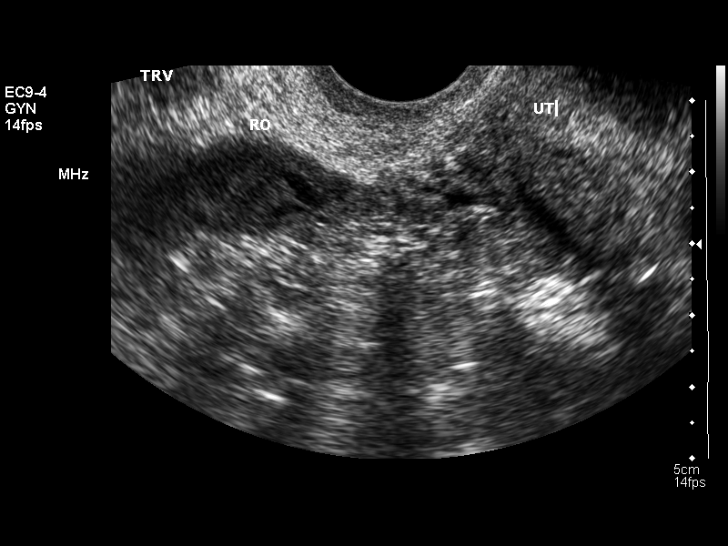
[im 67/67]
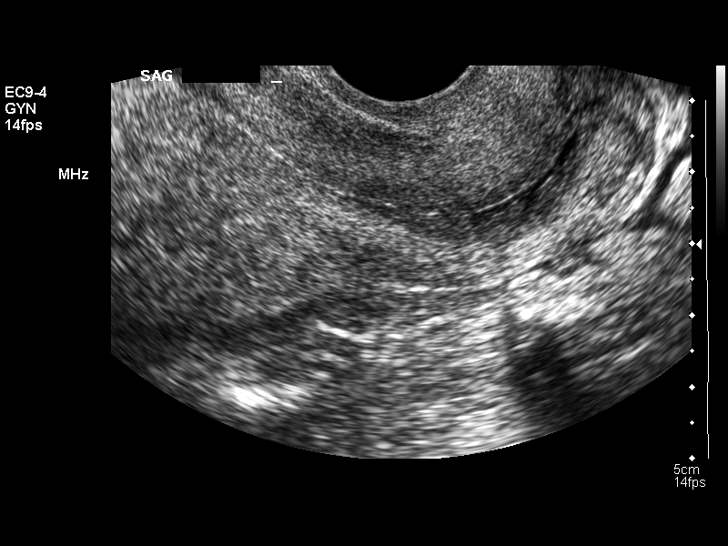

[14 of 25 positions shown; findings below may reference images not displayed]

It was necessary to proceed with endovaginal exam following the
transabdominal exam to visualize the endometrium and non-visualized
right ovary.
FINDINGS: Uterus: 9.7 x 4.9 x 4.7 cm.  Anteverted, anteflexed.  No focal
abnormality.

Endometrium: 5 mm, inhomogeneous, with suggestion of a focus of
internal color flow but no measurable abnormality.

Right ovary:  3.4 x 2.3 x 1.4 cm.  Normal.

Left ovary: 4.3 x 4.4 x 3.1 cm.  Normal.

Other findings: No free fluid
IMPRESSION: Mildly inhomogeneous endometrium without measurable thickening,
although there is suggestion of internal color flow that could
suggest an underlying polyp, submucosal fibroid, or less likely
hyperplasia/neoplasia.  Consider sonohysterography for further
evaluation if symptoms persist.

No acute abnormality.

## 2012-02-17 ENCOUNTER — Encounter (HOSPITAL_COMMUNITY): Payer: Self-pay | Admitting: *Deleted

## 2012-02-17 ENCOUNTER — Emergency Department (INDEPENDENT_AMBULATORY_CARE_PROVIDER_SITE_OTHER): Payer: BC Managed Care – PPO

## 2012-02-17 ENCOUNTER — Emergency Department (INDEPENDENT_AMBULATORY_CARE_PROVIDER_SITE_OTHER)
Admission: EM | Admit: 2012-02-17 | Discharge: 2012-02-17 | Disposition: A | Discharge status: Home / Self Care | Payer: BC Managed Care – PPO | Source: Home / Self Care | Attending: Family Medicine | Admitting: Family Medicine

## 2012-02-17 DIAGNOSIS — J020 Streptococcal pharyngitis: Secondary | ICD-10-CM

## 2012-02-17 LAB — POCT URINALYSIS DIP (DEVICE)
Ketones, ur: NEGATIVE mg/dL
Leukocytes, UA: NEGATIVE
Nitrite: NEGATIVE
Protein, ur: NEGATIVE mg/dL
Urobilinogen, UA: 1 mg/dL (ref 0.0–1.0)

## 2012-02-17 LAB — POCT RAPID STREP A: Streptococcus, Group A Screen (Direct): NEGATIVE

## 2012-02-17 MED ORDER — CEFDINIR 300 MG PO CAPS: 300.0000 mg | ORAL_CAPSULE | Freq: Two times a day (BID) | ORAL | Status: AC

## 2012-02-17 NOTE — Discharge Instructions (Signed)
Drink lots of fluids, take all of medicine, use lozenges as needed.return if needed °

## 2012-02-17 NOTE — ED Provider Notes (Signed)
History     CSN: [id number]  Arrival date & time 02/17/12  1723   First MD Initiated Contact with Patient 02/17/12 1755      Chief Complaint  Patient presents with  . Chills    (Consider location/radiation/quality/duration/timing/severity/associated sxs/prior treatment) Patient is a 36 y.o. female presenting with fever. The history is provided by the patient.  Fever Primary symptoms of the febrile illness include fever, cough and myalgias. Primary symptoms do not include abdominal pain, nausea, vomiting, diarrhea, dysuria or rash. The current episode started today. This is a new problem. The problem has been gradually worsening.  Risk factors for febrile illness include implanted device.   Past Medical History  Diagnosis Date  . Migraines   . Nephrolithiasis   . Right knee pain     Past Surgical History  Procedure Date  . Tubal ligation   . Appendectomy 2000  . Cholecystectomy 2000  . Kidney stone surgery 2000  . Knee arthroscopy 07-11-11    right knee, per Dr. Milly Jakob     Family History  Problem Relation Age of Onset  . Hypertension Mother     History  Substance Use Topics  . Smoking status: Never Smoker   . Smokeless tobacco: Never Used  . Alcohol Use: 0.6 oz/week    1 Glasses of wine per week    OB History    Grav Para Term Preterm Abortions TAB SAB Ect Mult Living   2 2 1 1      1       Review of Systems  Constitutional: Positive for fever and chills.  HENT: Positive for sore throat.   Respiratory: Positive for cough.   Cardiovascular: Positive for chest pain.  Gastrointestinal: Negative for nausea, vomiting, abdominal pain and diarrhea.  Genitourinary: Negative for dysuria.  Musculoskeletal: Positive for myalgias.  Skin: Negative for rash.    Allergies  Imitrex and Percocet  Home Medications   Current Outpatient Rx  Name Route Sig Dispense Refill  . AMITRIPTYLINE HCL 25 MG PO TABS Oral Take 25 mg by mouth at bedtime.      Marland Kitchen  CEFDINIR 300 MG PO CAPS Oral Take 1 capsule (300 mg total) by mouth 2 (two) times daily. 20 capsule 0  . HYDROCODONE-ACETAMINOPHEN 5-325 MG PO TABS Oral Take 1 tablet by mouth every 6 (six) hours as needed.      . INDOMETHACIN ER 75 MG PO CPCR Oral Take 75 mg by mouth 2 (two) times daily with a meal.      . LIDOCAINE 5 % EX PTCH Transdermal Place 1 patch onto the skin every 12 (twelve) hours. Remove & Discard patch within 12 hours or as directed by MD     . MEDROXYPROGESTERONE ACETATE 10 MG PO TABS Oral Take 2 tablets (20 mg total) by mouth daily. 30 tablet 2  . MELOXICAM 15 MG PO TABS Oral Take 15 mg by mouth daily.      Marland Kitchen PROMETHAZINE HCL 12.5 MG PO TABS Oral Take 12.5 mg by mouth every 6 (six) hours as needed.        BP 132/78  Pulse 114  Temp(Src) 100.5 F (38.1 C) (Oral)  Resp 22  SpO2 98%  LMP 02/13/2012  Physical Exam  Nursing note and vitals reviewed. Constitutional: She is oriented to person, place, and time. She appears well-developed and well-nourished.  HENT:  Right Ear: External ear normal.  Left Ear: External ear normal.  Mouth/Throat: Oropharyngeal exudate and posterior oropharyngeal erythema present.  Neck: Normal range of motion. Neck supple.  Cardiovascular: Normal rate, regular rhythm, normal heart sounds and intact distal pulses.   Pulmonary/Chest: Breath sounds normal.  Abdominal: Bowel sounds are normal. There is no tenderness.  Neurological: She is alert and oriented to person, place, and time.  Skin: Skin is warm and dry.    ED Course  Procedures (including critical care time)  Labs Reviewed  POCT URINALYSIS DIP (DEVICE) - Abnormal; Notable for the following:    Hgb urine dipstick MODERATE (*)    All other components within normal limits  POCT RAPID STREP A ([facility]ARE ONLY)   Dg Chest 2 View  02/17/2012  *RADIOLOGY REPORT*  Clinical Data: Chills, fever, and cough.  CHEST - 2 VIEW  Comparison: 03/26/2009  Findings: Cardiac and mediastinal contours  appear normal.  The lungs appear clear.  No pleural effusion is identified.  IMPRESSION:  No significant abnormality identified.  Original Report Authenticated By: Dellia Cloud, M.D.     1. Streptococcal sore throat       MDM  X-rays reviewed and report per radiologist.         Linna Hoff, MD 02/17/12 [id number]

## 2012-02-17 NOTE — ED Notes (Signed)
Pt   Reports    Symptoms     Of  Body   Aches     sorethroat        Chills     And     Fever         And  Pains in  Chest  As  Well  When  She  Takes  A  Deep breath   -    She  Is  Masked  And  Is  In a  Private  Room

## 2012-03-09 ENCOUNTER — Emergency Department (INDEPENDENT_AMBULATORY_CARE_PROVIDER_SITE_OTHER)
Admission: EM | Admit: 2012-03-09 | Discharge: 2012-03-09 | Disposition: A | Discharge status: Home / Self Care | Payer: BC Managed Care – PPO | Source: Home / Self Care | Attending: Emergency Medicine | Admitting: Emergency Medicine

## 2012-03-09 ENCOUNTER — Encounter (HOSPITAL_COMMUNITY): Payer: Self-pay

## 2012-03-09 DIAGNOSIS — T888XXA Other specified complications of surgical and medical care, not elsewhere classified, initial encounter: Secondary | ICD-10-CM

## 2012-03-09 DIAGNOSIS — M25569 Pain in unspecified knee: Secondary | ICD-10-CM

## 2012-03-09 DIAGNOSIS — T8090XA Unspecified complication following infusion and therapeutic injection, initial encounter: Secondary | ICD-10-CM

## 2012-03-09 MED ORDER — MELOXICAM 15 MG PO TABS: 15.0000 mg | ORAL_TABLET | Freq: Every day | ORAL | Status: DC

## 2012-03-09 MED ORDER — HYDROCODONE-ACETAMINOPHEN 5-325 MG PO TABS: 1.0000 | ORAL_TABLET | Freq: Four times a day (QID) | ORAL | Status: DC | PRN

## 2012-03-09 NOTE — ED Provider Notes (Signed)
History     CSN: [id number]  Arrival date & time 03/09/12  1843   First MD Initiated Contact with Patient 03/09/12 1847      Chief Complaint  Patient presents with  . Knee Pain    (Consider location/radiation/quality/duration/timing/severity/associated sxs/prior treatment) HPI Comments: Patient presents urgent care tonight describing exacerbated right knee pain. She got a cortisone knee injection yesterday for her knee pain. She is described that she is very discontent as she doesn't understand why she keeps having pain despite the surgery. She described that she had surgery in October after a dislocated kneecap. She has been taking tramadol Lortab and other medicines prescribed but to her for pain. She admits that she has an appointment next Tuesday with the orthopedics as she is frustrated with the current management of her knee pain. She describes that since yesterday after the injection her pain just gotten worse. She has tried to call you for orthopedics today but has not had any response as of yet. So she decided to come here.  She's having trouble bending and extending her knee and is very painful the pain shoots up all way to her hip area. Patient denies any fevers, chills and fatigue or changes in appetite. Patient also denies any recent injuries or falls. She's been taking tramadol every 4 hours for her pain she describes. Patient is also requesting a work note and 2 she can get to see her orthopedic and took next Tuesday  Patient is a 36 y.o. female presenting with knee pain. The history is provided by the patient.  Knee Pain This is a recurrent problem. The problem has been rapidly worsening. Pertinent negatives include no headaches and no shortness of breath. The symptoms are aggravated by bending and walking. The symptoms are relieved by nothing.    Past Medical History  Diagnosis Date  . Migraines   . Nephrolithiasis   . Right knee pain     Past Surgical History    Procedure Date  . Tubal ligation   . Appendectomy 2000  . Cholecystectomy 2000  . Kidney stone surgery 2000  . Knee arthroscopy 07-11-11    right knee, per Dr. Milly Jakob     Family History  Problem Relation Age of Onset  . Hypertension Mother     History  Substance Use Topics  . Smoking status: Never Smoker   . Smokeless tobacco: Never Used  . Alcohol Use: 0.6 oz/week    1 Glasses of wine per week    OB History    Grav Para Term Preterm Abortions TAB SAB Ect Mult Living   2 2 1 1      1       Review of Systems  Constitutional: Positive for activity change. Negative for fever, chills and appetite change.  Respiratory: Negative for shortness of breath.   Musculoskeletal: Positive for joint swelling, arthralgias and gait problem.  Neurological: Negative for headaches.    Allergies  Imitrex and Percocet  Home Medications   Current Outpatient Rx  Name Route Sig Dispense Refill  . TRAMADOL HCL 50 MG PO TABS Oral Take 50 mg by mouth every 6 (six) hours as needed.    Marland Kitchen AMITRIPTYLINE HCL 25 MG PO TABS Oral Take 25 mg by mouth at bedtime.      Marland Kitchen HYDROCODONE-ACETAMINOPHEN 5-325 MG PO TABS Oral Take 1 tablet by mouth every 6 (six) hours as needed. 10 tablet 0  . LIDOCAINE 5 % EX PTCH Transdermal Place 1 patch  onto the skin every 12 (twelve) hours. Remove & Discard patch within 12 hours or as directed by MD     . MEDROXYPROGESTERONE ACETATE 10 MG PO TABS Oral Take 2 tablets (20 mg total) by mouth daily. 30 tablet 2  . MELOXICAM 15 MG PO TABS Oral Take 1 tablet (15 mg total) by mouth daily. 7 tablet 0  . PROMETHAZINE HCL 12.5 MG PO TABS Oral Take 12.5 mg by mouth every 6 (six) hours as needed.        BP 145/83  Pulse 84  Temp(Src) 98.2 F (36.8 C) (Oral)  Resp 16  SpO2 98%  LMP 02/13/2012  Physical Exam  Nursing note and vitals reviewed. Constitutional: She appears well-developed and well-nourished.  Musculoskeletal: She exhibits tenderness. She exhibits no  edema.       Right knee: She exhibits decreased range of motion and swelling. She exhibits no ecchymosis, no laceration, no erythema and no LCL laxity. tenderness found.       Legs: Neurological: She is alert.  Skin: Skin is warm. No rash noted. No erythema.    ED Course  Procedures (including critical care time)  Labs Reviewed - No data to display No results found.   1. Knee pain   2. Injection Site Reaction       MDM  Patient with recurrent knee problems after what she reports was a kneecap dislocation. Patient to be experiencing a steroid flareup from her recent injection. We have discussed the imperative need for her to followup with her orthopedic doctor tomorrow we have agreed to provide her with a prescription of meloxicam and Lortab to help her with pain. We gave her a doctor's note for 2 days. She agreed that she will call them tomorrow as she got no response today.  After elevated discussion about her knee pain she also adds that she's also having headaches she suffers from cluster headaches I explained to her that possibly both of meloxicam and the Lortab could help her with her headaches. Also advised her to not take any over-the-counter NSAIDs    Jimmie Molly, MD 03/09/12 2017

## 2012-03-09 NOTE — ED Notes (Addendum)
C/o pain in her knee past 3 weeks ; recent visit to MD , who put a cortisone shot in her knee for the pain; he did not do an x-ray, and concerned about poss issues w her knee that will lnot be addressed until her visit to MD at [facility] , and " I would love to be off from work until my visit to [facility] next week"

## 2012-03-09 NOTE — ED Notes (Signed)
Both Dr Ladon Applebaum and myself had disusion w pt regarding plan for follow up. Pt will call her MD in AM, due to her worsening pain in knee for duty restrictions and for any further pain medication for her knee issues

## 2012-03-09 NOTE — Discharge Instructions (Signed)
We discuss several things #1 is imperative that you communicate tomorrow with your orthopedic providers. As a need to know that your pain has exacerbated. #2 we will give you a doctors note for today and tomorrow further extensions a work note will need to be provided by your per orthopedic provider. We agreed to prescribe you to medicines for your pain but need to discuss further pain management with your orthopedic provider as well    Joint Injection Care After Refer to this sheet in the next few days. These instructions provide you with information on caring for yourself after you have had a joint injection. Your caregiver also may give you more specific instructions. Your treatment has been planned according to current medical practices, but problems sometimes occur. Call your caregiver if you have any problems or questions after your procedure. After any type of joint injection, it is not uncommon to experience:  Soreness, swelling, or bruising around the injection site.   Mild numbness, tingling, or weakness around the injection site caused by the numbing medicine used before or with the injection.  It also is possible to experience the following effects associated with the specific agent after injection:  Iodine-based contrast agents:   Allergic reaction (itching, hives, widespread redness, and swelling beyond the injection site).   Corticosteroids (These effects are rare.):   Allergic reaction.   Increased blood sugar levels (If you have diabetes and you notice that your blood sugar levels have increased, notify your caregiver).   Increased blood pressure levels.   Mood swings.   Hyaluronic acid in the use of viscosupplementation.   Temporary heat or redness.   Temporary rash and itching.   Increased fluid accumulation in the injected joint.  These effects all should resolve within a day after your procedure.  HOME CARE INSTRUCTIONS  Limit yourself to light activity the  day of your procedure. Avoid lifting heavy objects, bending, stooping, or twisting.   Take prescription or over-the-counter pain medication as directed by your caregiver.   You may apply ice to your injection site to reduce pain and swelling the day of your procedure. Ice may be applied 3 to 4 times:   Put ice in a plastic bag.   Place a towel between your skin and the bag.   Leave the ice on for no longer than 15 to 20 minutes each time.  SEEK IMMEDIATE MEDICAL CARE IF:   Pain and swelling get worse rather than better or extend beyond the injection site.   Numbness does not go away.   Blood or fluid continues to leak from the injection site.   You have chest pain.   You have swelling of your face or tongue.   You have trouble breathing or you become dizzy.   You develop a fever, chills, or severe tenderness at the injection site that last longer than 1 day.  MAKE SURE YOU:  Understand these instructions.   Watch your condition.   Get help right away if you are not doing well or if you get worse.  Document Released: 06/05/2011 Document Revised: 09/11/2011 Document Reviewed: 06/05/2011 [facility] Patient Information 2012 [place], [place].Knee Pain The knee is the complex joint between your thigh and your lower leg. It is made up of bones, tendons, ligaments, and cartilage. The bones that make up the knee are:  The femur in the thigh.   The tibia and fibula in the lower leg.   The patella or kneecap riding in the groove on  the lower femur.  CAUSES  Knee pain is a common complaint with many causes. A few of these causes are:  Injury, such as:   A ruptured ligament or tendon injury.   Torn cartilage.   Medical conditions, such as:   Gout   Arthritis   Infections   Overuse, over training or overdoing a physical activity.  Knee pain can be minor or severe. Knee pain can accompany debilitating injury. Minor knee problems often respond well to self-care measures or  get well on their own. More serious injuries may need medical intervention or even surgery. SYMPTOMS The knee is complex. Symptoms of knee problems can vary widely. Some of the problems are:  Pain with movement and weight bearing.   Swelling and tenderness.   Buckling of the knee.   Inability to straighten or extend your knee.   Your knee locks and you cannot straighten it.   Warmth and redness with pain and fever.   Deformity or dislocation of the kneecap.  DIAGNOSIS  Determining what is wrong may be very straight forward such as when there is an injury. It can also be challenging because of the complexity of the knee. Tests to make a diagnosis may include:  Your caregiver taking a history and doing a physical exam.   Routine X-rays can be used to rule out other problems. X-rays will not reveal a cartilage tear. Some injuries of the knee can be diagnosed by:   Arthroscopy a surgical technique by which a small video camera is inserted through tiny incisions on the sides of the knee. This procedure is used to examine and repair internal knee joint problems. Tiny instruments can be used during arthroscopy to repair the torn knee cartilage (meniscus).   Arthrography is a radiology technique. A contrast liquid is directly injected into the knee joint. Internal structures of the knee joint then become visible on X-ray film.   An MRI scan is a non x-ray radiology procedure in which magnetic fields and a computer produce two- or three-dimensional images of the inside of the knee. Cartilage tears are often visible using an MRI scanner. MRI scans have largely replaced arthrography in diagnosing cartilage tears of the knee.   Blood work.   Examination of the fluid that helps to lubricate the knee joint (synovial fluid). This is done by taking a sample out using a needle and a syringe.  TREATMENT The treatment of knee problems depends on the cause. Some of these treatments are:  Depending on  the injury, proper casting, splinting, surgery or physical therapy care will be needed.   Give yourself adequate recovery time. Do not overuse your joints. If you begin to get sore during workout routines, back off. Slow down or do fewer repetitions.   For repetitive activities such as cycling or running, maintain your strength and nutrition.   Alternate muscle groups. For example if you are a weight lifter, work the upper body on one day and the lower body the next.   Either tight or weak muscles do not give the proper support for your knee. Tight or weak muscles do not absorb the stress placed on the knee joint. Keep the muscles surrounding the knee strong.   Take care of mechanical problems.   If you have flat feet, orthotics or special shoes may help. See your caregiver if you need help.   Arch supports, sometimes with wedges on the inner or outer aspect of the heel, can help. These can shift  pressure away from the side of the knee most bothered by osteoarthritis.   A brace called an "unloader" brace also may be used to help ease the pressure on the most arthritic side of the knee.   If your caregiver has prescribed crutches, braces, wraps or ice, use as directed. The acronym for this is PRICE. This means protection, rest, ice, compression and elevation.   Nonsteroidal anti-inflammatory drugs (NSAID's), can help relieve pain. But if taken immediately after an injury, they may actually increase swelling. Take NSAID's with food in your stomach. Stop them if you develop stomach problems. Do not take these if you have a history of ulcers, stomach pain or bleeding from the bowel. Do not take without your caregiver's approval if you have problems with fluid retention, heart failure, or kidney problems.   For ongoing knee problems, physical therapy may be helpful.   Glucosamine and chondroitin are over-the-counter dietary supplements. Both may help relieve the pain of osteoarthritis in the knee.  These medicines are different from the usual anti-inflammatory drugs. Glucosamine may decrease the rate of cartilage destruction.   Injections of a corticosteroid drug into your knee joint may help reduce the symptoms of an arthritis flare-up. They may provide pain relief that lasts a few months. You may have to wait a few months between injections. The injections do have a small increased risk of infection, water retention and elevated blood sugar levels.   Hyaluronic acid injected into damaged joints may ease pain and provide lubrication. These injections may work by reducing inflammation. A series of shots may give relief for as long as 6 months.   Topical painkillers. Applying certain ointments to your skin may help relieve the pain and stiffness of osteoarthritis. Ask your pharmacist for suggestions. Many over the-counter products are approved for temporary relief of arthritis pain.   In some countries, doctors often prescribe topical NSAID's for relief of chronic conditions such as arthritis and tendinitis. A review of treatment with NSAID creams found that they worked as well as oral medications but without the serious side effects.  PREVENTION  Maintain a healthy weight. Extra pounds put more strain on your joints.   Get strong, stay limber. Weak muscles are a common cause of knee injuries. Stretching is important. Include flexibility exercises in your workouts.   Be smart about exercise. If you have osteoarthritis, chronic knee pain or recurring injuries, you may need to change the way you exercise. This does not mean you have to stop being active. If your knees ache after jogging or playing basketball, consider switching to swimming, water aerobics or other low-impact activities, at least for a few days a week. Sometimes limiting high-impact activities will provide relief.   Make sure your shoes fit well. Choose footwear that is right for your sport.   Protect your knees. Use the proper  gear for knee-sensitive activities. Use kneepads when playing volleyball or laying carpet. Buckle your seat belt every time you drive. Most shattered kneecaps occur in car accidents.   Rest when you are tired.  SEEK MEDICAL CARE IF:  You have knee pain that is continual and does not seem to be getting better.  SEEK IMMEDIATE MEDICAL CARE IF:  Your knee joint feels hot to the touch and you have a high fever. MAKE SURE YOU:   Understand these instructions.   Will watch your condition.   Will get help right away if you are not doing well or get worse.  Document Released: 07/20/2007  Document Revised: 09/11/2011 Document Reviewed: 07/20/2007 [facility] Patient Information 2012 [place], [place].

## 2012-05-26 ENCOUNTER — Telehealth: Payer: Self-pay | Admitting: Family Medicine

## 2012-05-26 NOTE — Telephone Encounter (Signed)
I don't know because this has not been checked since 09-01-11. They would need to get another Hgb to find out

## 2012-05-26 NOTE — Telephone Encounter (Signed)
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  Please put it to Dr. Tedd Sias attention and fax to [phone].

## 2012-05-27 NOTE — Telephone Encounter (Signed)
I spoke with pt and she had labs drawn on 05/26/12, she will call and make sure this was ordered.

## 2012-06-03 HISTORY — PX: TOTAL HIP ARTHROPLASTY: SHX124

## 2012-07-16 ENCOUNTER — Ambulatory Visit (INDEPENDENT_AMBULATORY_CARE_PROVIDER_SITE_OTHER): Payer: BC Managed Care – PPO | Admitting: Family Medicine

## 2012-07-16 ENCOUNTER — Encounter: Payer: Self-pay | Admitting: Family Medicine

## 2012-07-16 ENCOUNTER — Other Ambulatory Visit (HOSPITAL_COMMUNITY)
Admission: RE | Admit: 2012-07-16 | Discharge: 2012-07-16 | Disposition: A | Discharge status: Home / Self Care | Payer: BC Managed Care – PPO | Source: Ambulatory Visit | Attending: Family Medicine | Admitting: Family Medicine

## 2012-07-16 VITALS — BP 140/86 | HR 103 | Temp 98.3°F | Ht 64.75 in | Wt 183.0 lb

## 2012-07-16 DIAGNOSIS — Z0000 Encounter for general adult medical examination without abnormal findings: Secondary | ICD-10-CM

## 2012-07-16 DIAGNOSIS — Z01419 Encounter for gynecological examination (general) (routine) without abnormal findings: Secondary | ICD-10-CM | POA: Insufficient documentation

## 2012-07-16 DIAGNOSIS — D649 Anemia, unspecified: Secondary | ICD-10-CM

## 2012-07-16 LAB — CBC WITH DIFFERENTIAL/PLATELET
Basophils Absolute: 0 10*3/uL (ref 0.0–0.1)
Hemoglobin: 11 g/dL — ABNORMAL LOW (ref 12.0–15.0)
Lymphocytes Relative: 33.9 % (ref 12.0–46.0)
Monocytes Relative: 6.8 % (ref 3.0–12.0)
Neutrophils Relative %: 57.5 % (ref 43.0–77.0)
Platelets: 368 10*3/uL (ref 150.0–400.0)
RDW: 15.3 % — ABNORMAL HIGH (ref 11.5–14.6)

## 2012-07-16 LAB — TSH: TSH: 1.02 u[IU]/mL (ref 0.35–5.50)

## 2012-07-16 MED ORDER — CYCLOBENZAPRINE HCL 10 MG PO TABS: 10.0000 mg | ORAL_TABLET | Freq: Three times a day (TID) | ORAL | Status: DC | PRN

## 2012-07-16 NOTE — Addendum Note (Signed)
Addended by: Aniceto Boss A on: 07/16/2012 11:37 AM   Modules accepted: Orders

## 2012-07-16 NOTE — Progress Notes (Signed)
Subjective:    Patient ID: Tanya Page, female    DOB: [birth year], 36 y.o.   MRN: [id number]  HPI 36 yr old female for a a cpx. She is doing well in general, although she is still recovering from her right total hip replacement in August. She was on crutches until last week, now she uses a cane. She had been getting Home Health PT, but next week will begin outpatient PT. She hopes to return to work in December. Otherwise no concerns.    Review of Systems  Constitutional: Negative.  Negative for fever, diaphoresis, activity change, appetite change, fatigue and unexpected weight change.  HENT: Negative.  Negative for hearing loss, ear pain, nosebleeds, congestion, sore throat, trouble swallowing, neck pain, neck stiffness, voice change and tinnitus.   Eyes: Negative.  Negative for photophobia, pain, discharge, redness and visual disturbance.  Respiratory: Negative.  Negative for apnea, cough, choking, chest tightness, shortness of breath, wheezing and stridor.   Cardiovascular: Negative.  Negative for chest pain, palpitations and leg swelling.  Gastrointestinal: Negative.  Negative for nausea, vomiting, abdominal pain, diarrhea, constipation, blood in stool, abdominal distention and rectal pain.  Genitourinary: Negative.  Negative for dysuria, urgency, frequency, hematuria, flank pain, vaginal bleeding, vaginal discharge, enuresis, difficulty urinating, vaginal pain and menstrual problem.  Musculoskeletal: Negative.  Negative for myalgias, back pain, joint swelling, arthralgias and gait problem.  Skin: Negative.  Negative for color change, pallor, rash and wound.  Neurological: Negative.  Negative for dizziness, tremors, seizures, syncope, speech difficulty, weakness, light-headedness, numbness and headaches.  Hematological: Negative.  Negative for adenopathy. Does not bruise/bleed easily.  Psychiatric/Behavioral: Negative.  Negative for hallucinations, behavioral problems, confusion,  disturbed wake/sleep cycle, dysphoric mood and agitation. The patient is not nervous/anxious.        Objective:   Physical Exam  Constitutional: She appears well-developed and well-nourished. No distress.  HENT:  Head: Normocephalic and atraumatic.  Right Ear: External ear normal.  Left Ear: External ear normal.  Nose: Nose normal.  Mouth/Throat: Oropharynx is clear and moist. No oropharyngeal exudate.  Eyes: Conjunctivae normal and EOM are normal. Pupils are equal, round, and reactive to light. Right eye exhibits no discharge. Left eye exhibits no discharge. No scleral icterus.  Neck: Normal range of motion. Neck supple. No JVD present. No thyromegaly present.  Cardiovascular: Normal rate, regular rhythm, normal heart sounds and intact distal pulses.  Exam reveals no gallop and no friction rub.   No murmur heard. Pulmonary/Chest: Effort normal and breath sounds normal. No stridor. No respiratory distress. She has no wheezes. She has no rales. She exhibits no tenderness.  Abdominal: Soft. Normal appearance and bowel sounds are normal. She exhibits no distension, no abdominal bruit, no ascites and no mass. There is no hepatosplenomegaly. There is no tenderness. There is no rigidity, no rebound and no guarding. No hernia.  Genitourinary: Rectum normal, vagina normal and uterus normal. No breast swelling, tenderness, discharge or bleeding. Cervix exhibits no motion tenderness, no discharge and no friability. Right adnexum displays no mass, no tenderness and no fullness. Left adnexum displays no mass, no tenderness and no fullness. No erythema, tenderness or bleeding around the vagina. No vaginal discharge found.  Musculoskeletal: Normal range of motion. She exhibits no edema and no tenderness.  Lymphadenopathy:    She has no cervical adenopathy.  Neurological: She is alert. She has normal reflexes. No cranial nerve deficit. She exhibits normal muscle tone. Coordination normal.  Skin: Skin is  warm and dry. No rash  noted. She is not diaphoretic. No erythema. No pallor.  Psychiatric: She has a normal mood and affect. Her behavior is normal. Judgment and thought content normal.          Assessment & Plan:  Well exam. She will set up her first screening mammogram. Get some labs.

## 2012-07-17 LAB — HIV ANTIBODY (ROUTINE TESTING W REFLEX): HIV: NONREACTIVE

## 2012-07-21 NOTE — Progress Notes (Signed)
Quick Note:  I left a voice message for pt to return my call to discuss results. ______

## 2012-07-22 MED ORDER — METRONIDAZOLE 500 MG PO TABS: 500.0000 mg | ORAL_TABLET | Freq: Three times a day (TID) | ORAL | Status: DC

## 2012-07-22 NOTE — Addendum Note (Signed)
Addended by: Aniceto Boss A on: 07/22/2012 12:10 PM   Modules accepted: Orders

## 2012-07-22 NOTE — Progress Notes (Signed)
Quick Note:  I left another message for pt to return my call. I sent script in to [facility] pharmacy. I was going to put a copy of results in mail but there is no address in computer, just says [place]. I explained this to Dr. Clent Ridges and he said to just hold the results at my desk. ______

## 2012-07-26 ENCOUNTER — Ambulatory Visit: Payer: BC Managed Care – PPO | Attending: Orthopedic Surgery | Admitting: Physical Therapy

## 2012-07-26 ENCOUNTER — Telehealth: Payer: Self-pay | Admitting: Family Medicine

## 2012-07-26 NOTE — Telephone Encounter (Signed)
I spoke with pt and went over lab results. I also put correct phone number and address in computer.

## 2012-08-18 ENCOUNTER — Other Ambulatory Visit: Payer: BC Managed Care – PPO

## 2012-12-08 ENCOUNTER — Other Ambulatory Visit (HOSPITAL_COMMUNITY)
Admission: RE | Admit: 2012-12-08 | Discharge: 2012-12-08 | Disposition: A | Discharge status: Home / Self Care | Payer: Self-pay | Source: Ambulatory Visit | Attending: Internal Medicine | Admitting: Internal Medicine

## 2012-12-08 ENCOUNTER — Encounter (HOSPITAL_COMMUNITY): Payer: Self-pay

## 2012-12-08 ENCOUNTER — Emergency Department (INDEPENDENT_AMBULATORY_CARE_PROVIDER_SITE_OTHER)
Admission: EM | Admit: 2012-12-08 | Discharge: 2012-12-08 | Disposition: A | Discharge status: Home / Self Care | Payer: Self-pay | Source: Home / Self Care

## 2012-12-08 DIAGNOSIS — A499 Bacterial infection, unspecified: Secondary | ICD-10-CM

## 2012-12-08 DIAGNOSIS — N760 Acute vaginitis: Secondary | ICD-10-CM | POA: Insufficient documentation

## 2012-12-08 DIAGNOSIS — Z113 Encounter for screening for infections with a predominantly sexual mode of transmission: Secondary | ICD-10-CM | POA: Insufficient documentation

## 2012-12-08 DIAGNOSIS — N72 Inflammatory disease of cervix uteri: Secondary | ICD-10-CM

## 2012-12-08 DIAGNOSIS — B9689 Other specified bacterial agents as the cause of diseases classified elsewhere: Secondary | ICD-10-CM

## 2012-12-08 DIAGNOSIS — N898 Other specified noninflammatory disorders of vagina: Secondary | ICD-10-CM

## 2012-12-08 LAB — POCT URINALYSIS DIP (DEVICE)
Bilirubin Urine: NEGATIVE
Glucose, UA: NEGATIVE mg/dL
Leukocytes, UA: NEGATIVE
Nitrite: NEGATIVE
Urobilinogen, UA: 0.2 mg/dL (ref 0.0–1.0)

## 2012-12-08 MED ORDER — METRONIDAZOLE 500 MG PO TABS: 500.0000 mg | ORAL_TABLET | Freq: Two times a day (BID) | ORAL | Status: DC

## 2012-12-08 NOTE — ED Provider Notes (Signed)
Medical screening examination/treatment/procedure(s) were performed by non-physician practitioner and as supervising physician I was immediately available for consultation/collaboration.  David Keller, M.D.  David C Keller, MD 12/08/12 2021 

## 2012-12-08 NOTE — ED Notes (Signed)
Patient is resting comfortably. 

## 2012-12-08 NOTE — ED Provider Notes (Signed)
History     CSN: [id number]  Arrival date & time 12/08/12  1059   First MD Initiated Contact with Patient 12/08/12 1135      Chief Complaint  Patient presents with  . Hematuria    (Consider location/radiation/quality/duration/timing/severity/associated sxs/prior treatment) HPI Comments: 37 year old female complaining of suprapubic pain and pain in the right low back 3 days. She states that after urinating when wiping she sees scant blood. The pain is located in the suprapubic area and without radiation. The right low back pain is located in the right para lumbar musculature. She has been taking Tylenol without relief of pain. She denies fever, urinary symptoms, chills, abdominal pain, vomiting or diarrhea.   Past Medical History  Diagnosis Date  . Migraines   . Nephrolithiasis   . Right knee pain     Past Surgical History  Procedure Laterality Date  . Tubal ligation    . Appendectomy  2000  . Cholecystectomy  2000  . Kidney stone surgery  2000  . Knee arthroscopy  07-11-11    right knee, per Dr. Milly Jakob   . Total hip arthroplasty  06-03-12    right hip, per Dr. Guerry [facility]in at [facility] History  Problem Relation Age of Onset  . Hypertension Mother     History  Substance Use Topics  . Smoking status: Never Smoker   . Smokeless tobacco: Never Used  . Alcohol Use: 0.6 oz/week    1 Glasses of wine per week    OB History   Grav Para Term Preterm Abortions TAB SAB Ect Mult Living   2 2 1 1      1       Review of Systems  Constitutional: Negative.   HENT: Negative.   Respiratory: Negative.   Cardiovascular: Negative.   Gastrointestinal: Negative.   Genitourinary: Positive for hematuria and vaginal discharge. Negative for dysuria, flank pain, decreased urine volume, difficulty urinating and pelvic pain.  Musculoskeletal: Positive for back pain.  Skin: Negative.   Neurological: Negative.   Psychiatric/Behavioral: Negative.     Allergies   Imitrex and Percocet  Home Medications   Current Outpatient Rx  Name  Route  Sig  Dispense  Refill  . cyclobenzaprine (FLEXERIL) 10 MG tablet   Oral   Take 1 tablet (10 mg total) by mouth 3 (three) times daily as needed for muscle spasms (sleep).   60 tablet   5   . metroNIDAZOLE (FLAGYL) 500 MG tablet   Oral   Take 1 tablet (500 mg total) by mouth 3 (three) times daily.   30 tablet   0   . metroNIDAZOLE (FLAGYL) 500 MG tablet   Oral   Take 1 tablet (500 mg total) by mouth 2 (two) times daily. X 7 days   14 tablet   0   . zolpidem (AMBIEN) 5 MG tablet   Oral   Take 5 mg by mouth at bedtime as needed.           BP 193/93  Pulse 87  Temp(Src) 97.9 F (36.6 C) (Oral)  SpO2 99%  LMP 11/26/2012  Physical Exam  Nursing note and vitals reviewed. Constitutional: She is oriented to person, place, and time. She appears well-developed and well-nourished. She appears distressed.  Eyes: EOM are normal.  Neck: Normal range of motion. Neck supple.  Cardiovascular: Normal rate, regular rhythm and normal heart sounds.   Pulmonary/Chest: Effort normal and breath sounds normal. No respiratory distress.  Abdominal: Soft. Bowel sounds are normal. She exhibits no distension and no mass. There is no tenderness. There is no rebound and no guarding.  Genitourinary:  Carlo exam reveals tenderness directly over the suprapubic is with manual pressure.  Musculoskeletal:  Direct tenderness over the right lateral paralumbar musculature. No flank tenderness or percussion tenderness.  Neurological: She is alert and oriented to person, place, and time. She exhibits normal muscle tone.  Skin: Skin is warm and dry.  Psychiatric: She has a normal mood and affect.    ED Course  Pelvic exam Date/Time: 12/08/2012 2:13 PM Performed by: Phineas Real DAVID Authorized by: Calvert Cantor Consent: Verbal consent obtained. Risks and benefits: risks, benefits and alternatives were discussed Consent given by:  patient Patient understanding: patient states understanding of the procedure being performed Patient identity confirmed: verbally with patient Local anesthesia used: no Patient sedated: no Patient tolerance: Patient tolerated the procedure well with no immediate complications. Comments: Normal external female genitalia. Proximal vagina and vaginal vault is coated and field with a creamy pale white to green bubbly discharge. The cervix is posterior and left of midline. The ectocervix is without lesions however, is friable. Collection of fluids from the os creates light bleeding. The cervix is friable. Bimanual: No adnexal tenderness, positive cervical motion tenderness.   (including critical care time)  Labs Reviewed  POCT URINALYSIS DIP (DEVICE)  POCT PREGNANCY, URINE  CERVICOVAGINAL ANCILLARY ONLY   No results found.  Results for orders placed during the hospital encounter of 12/08/12  POCT URINALYSIS DIP (DEVICE)      Result Value Range   Glucose, UA NEGATIVE  NEGATIVE mg/dL   Bilirubin Urine NEGATIVE  NEGATIVE   Ketones, ur NEGATIVE  NEGATIVE mg/dL   Specific Gravity, Urine >=1.030  1.005 - 1.030   Hgb urine dipstick NEGATIVE  NEGATIVE   pH 6.0  5.0 - 8.0   Protein, ur NEGATIVE  NEGATIVE mg/dL   Urobilinogen, UA 0.2  0.0 - 1.0 mg/dL   Nitrite NEGATIVE  NEGATIVE   Leukocytes, UA NEGATIVE  NEGATIVE  POCT PREGNANCY, URINE      Result Value Range   Preg Test, Ur NEGATIVE  NEGATIVE      1. Cervicitis   2. BV (bacterial vaginosis)   3. Foul smelling vaginal discharge       MDM  The patient is discharged in stable condition. Ttreated with Flagyl 500 twice a day for 7 days For development of increased pain, bleeding, nausea or vomiting, fever may return or go to the emergency department.       Hayden Rasmussen, NP 12/08/12 1416

## 2012-12-08 NOTE — ED Notes (Signed)
States she has been having blood in her urine and pain low abdominal and low back area since 3-2; pain "5"; NAD

## 2012-12-08 NOTE — ED Notes (Signed)
Please call [phone] for lab issues

## 2012-12-10 NOTE — ED Notes (Signed)
GC/Chlamdyia neg., Affirm: Candida and Trich neg.,  Gardnerella pos. Pt. Adequately treated with Flagyl. Vassie Moselle 12/10/2012.

## 2013-03-18 ENCOUNTER — Inpatient Hospital Stay (HOSPITAL_COMMUNITY)
Admission: AD | Admit: 2013-03-18 | Discharge: 2013-03-18 | Disposition: A | Discharge status: Home / Self Care | Payer: Self-pay | Source: Ambulatory Visit | Attending: Obstetrics & Gynecology | Admitting: Obstetrics & Gynecology

## 2013-03-18 ENCOUNTER — Encounter (HOSPITAL_COMMUNITY): Payer: Self-pay

## 2013-03-18 DIAGNOSIS — N926 Irregular menstruation, unspecified: Secondary | ICD-10-CM | POA: Insufficient documentation

## 2013-03-18 DIAGNOSIS — N644 Mastodynia: Secondary | ICD-10-CM

## 2013-03-18 DIAGNOSIS — R21 Rash and other nonspecific skin eruption: Secondary | ICD-10-CM | POA: Insufficient documentation

## 2013-03-18 DIAGNOSIS — B372 Candidiasis of skin and nail: Secondary | ICD-10-CM

## 2013-03-18 DIAGNOSIS — Z96649 Presence of unspecified artificial hip joint: Secondary | ICD-10-CM | POA: Insufficient documentation

## 2013-03-18 MED ORDER — NYSTATIN 100000 UNIT/GM EX CREA: TOPICAL_CREAM | CUTANEOUS | Status: DC

## 2013-03-18 MED ORDER — TRIAMCINOLONE ACETONIDE 0.1 % EX CREA: TOPICAL_CREAM | Freq: Two times a day (BID) | CUTANEOUS | Status: DC

## 2013-03-18 NOTE — MAU Provider Note (Signed)
History     CSN: [id number]  Arrival date and time: 03/18/13 1600   First Provider Initiated Contact with Patient 03/18/13 1647      No chief complaint on file.  HPI Tanya Page is 37 y.o. G2P1101 presents with pain in the left breast that began X 2 months.  Denies redness, changes in the skin or shape of the breast or nipple discharge.  She does have a "rash between the upper breast that itches.  States she has gained a lot weight and thought the increase in size was the cause. Menstrual cycles have been irregular since BTL 12 years ago.  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  She has recently gotten back to work.       Past Medical History  Diagnosis Date  . Migraines   . Nephrolithiasis   . Right knee pain     Past Surgical History  Procedure Laterality Date  . Tubal ligation    . Appendectomy  2000  . Cholecystectomy  2000  . Kidney stone surgery  2000  . Knee arthroscopy  07-11-11    right knee, per Dr. Milly Jakob   . Total hip arthroplasty  06-03-12    right hip, per Dr. Guerry Bruin at [facility] History  Problem Relation Age of Onset  . Hypertension Mother     History  Substance Use Topics  . Smoking status: Never Smoker   . Smokeless tobacco: Never Used  . Alcohol Use: 0.6 oz/week    1 Glasses of wine per week    Allergies:  Allergies  Allergen Reactions  . Imitrex (Sumatriptan Base) Shortness Of Breath    chest tightness  . Percocet (Oxycodone-Acetaminophen) Other (See Comments)    Hallucinations    Prescriptions prior to admission  Medication Sig Dispense Refill  . cyclobenzaprine (FLEXERIL) 10 MG tablet Take 10 mg by mouth 3 (three) times daily as needed for muscle spasms.      . diphenhydrAMINE (SOMINEX) 25 MG tablet Take 25 mg by mouth at bedtime as needed for allergies or sleep.      . Ibuprofen-Diphenhydramine Cit (ADVIL PM PO) Take 2 tablets by mouth at bedtime as needed (for sleep).      .  Tetrahydrozoline HCl (VISINE OP) Apply 2 drops to eye 2 (two) times daily as needed (for dry eyes).        Review of Systems  Genitourinary:       Left sided breast pain X 2 months without nipple discharge, skin changes or redness/masses  Skin: Positive for rash (on the chest wall between the breast, pruritic, without pain or oozing).   Physical Exam   Blood pressure 129/93, pulse 93, temperature 98.3 F (36.8 C), temperature source Oral, resp. rate 16, height 5\' 6"  (1.676 m), weight 190 lb 6 oz (86.354 kg), last menstrual period 02/24/2013.  Physical Exam  Constitutional: She is oriented to person, place, and time. She appears well-developed and well-nourished. No distress.  HENT:  Head: Normocephalic.  Neck: Normal range of motion.  Cardiovascular: Normal rate.   Respiratory: Effort normal.  Genitourinary: No breast swelling, tenderness, discharge or bleeding.  Neurological: She is alert and oriented to person, place, and time.  Skin: Skin is warm and dry. Rash (there is a dry rash on the chest wall b/t the upper breast.  Does not appear to be associated with the breast tissue.  ) noted.  Psychiatric: She has  a normal mood and affect. Her behavior is normal.   Results for orders placed during the hospital encounter of 03/18/13 (from the past 24 hour(s))  POCT PREGNANCY, URINE     Status: None   Collection Time    03/18/13  4:31 PM      Result Value Range   Preg Test, Ur NEGATIVE  NEGATIVE   MAU Course  Procedures  MDM  Assessment and Plan  A:  Normal breast exam       Skin rash, probably yeast on the chest wall  P:  Rx for nystatin cream and triamcinolone cream to mix together sent to pharmacy bid       Patient was instructed to Call Christine at [facility] to scheduled mammogram.  Phone number given to patient.  KEY,EVE M 03/18/2013, 5:26 PM

## 2013-03-18 NOTE — MAU Note (Signed)
Pt states has had severe pain in left breast for roughly 2 months. Has been getting progressively worse. Pain is on outer portion of left breast moving now into center of breast. Hx of vascular necrosis needing hip replacement.

## 2013-03-20 NOTE — MAU Provider Note (Signed)
Attestation of Attending Supervision of Advanced Practitioner (PA/CNM/NP): Evaluation and management procedures were performed by the Advanced Practitioner under my supervision and collaboration.  I have reviewed the Advanced Practitioner's note and chart, and I agree with the management and plan.  Maurion Walkowiak, MD, FACOG Attending Obstetrician & Gynecologist Faculty Practice, [facility]  

## 2013-03-22 ENCOUNTER — Other Ambulatory Visit: Payer: Self-pay | Admitting: Obstetrics and Gynecology

## 2013-03-22 DIAGNOSIS — N644 Mastodynia: Secondary | ICD-10-CM

## 2013-03-22 DIAGNOSIS — N6320 Unspecified lump in the left breast, unspecified quadrant: Secondary | ICD-10-CM

## 2013-04-15 ENCOUNTER — Ambulatory Visit
Admission: RE | Admit: 2013-04-15 | Discharge: 2013-04-15 | Disposition: A | Discharge status: Home / Self Care | Payer: No Typology Code available for payment source | Source: Ambulatory Visit | Attending: Obstetrics and Gynecology | Admitting: Obstetrics and Gynecology

## 2013-04-15 DIAGNOSIS — N644 Mastodynia: Secondary | ICD-10-CM

## 2013-04-15 DIAGNOSIS — N6320 Unspecified lump in the left breast, unspecified quadrant: Secondary | ICD-10-CM

## 2013-06-22 ENCOUNTER — Telehealth: Payer: Self-pay | Admitting: Family Medicine

## 2013-06-22 NOTE — Telephone Encounter (Signed)
Per Dr. Clent Ridges, okay to schedule for the 3:15 pm & 3:30 pm slot.

## 2013-06-22 NOTE — Telephone Encounter (Addendum)
Pt would like to know if you would allow her to come in for a cpe on a Friday afternoon, after 3pm? ( October 31) That is the only day off she can get off work early. She just started this new job. pls advise

## 2013-06-29 NOTE — Telephone Encounter (Signed)
Pt aware.

## 2013-06-29 NOTE — Telephone Encounter (Signed)
Scheduled for pt/lmom/kh

## 2013-08-05 ENCOUNTER — Encounter: Payer: Self-pay | Admitting: Family Medicine

## 2014-03-20 ENCOUNTER — Encounter (HOSPITAL_COMMUNITY): Payer: Self-pay | Admitting: Emergency Medicine

## 2014-03-20 ENCOUNTER — Emergency Department (HOSPITAL_COMMUNITY)
Admission: EM | Admit: 2014-03-20 | Discharge: 2014-03-20 | Disposition: A | Discharge status: Home / Self Care | Payer: BC Managed Care – PPO | Source: Home / Self Care

## 2014-03-20 DIAGNOSIS — J029 Acute pharyngitis, unspecified: Secondary | ICD-10-CM

## 2014-03-20 LAB — POCT RAPID STREP A: STREPTOCOCCUS, GROUP A SCREEN (DIRECT): NEGATIVE

## 2014-03-20 NOTE — Discharge Instructions (Signed)
Pharyngitis °Pharyngitis is a sore throat (pharynx). There is redness, pain, and swelling of your throat. °HOME CARE  °· Drink enough fluids to keep your pee (urine) clear or pale yellow. °· Only take medicine as told by your doctor. °· You may get sick again if you do not take medicine as told. Finish your medicines, even if you start to feel better. °· Do not take aspirin. °· Rest. °· Rinse your mouth (gargle) with salt water (½ tsp of salt per 1 qt of water) every 1 2 hours. This will help the pain. °· If you are not at risk for choking, you can suck on hard candy or sore throat lozenges. °GET HELP IF: °· You have large, tender lumps on your neck. °· You have a rash. °· You cough up green, yellow-brown, or bloody spit. °GET HELP RIGHT AWAY IF:  °· You have a stiff neck. °· You drool or cannot swallow liquids. °· You throw up (vomit) or are not able to keep medicine or liquids down. °· You have very bad pain that does not go away with medicine. °· You have problems breathing (not from a stuffy nose). °MAKE SURE YOU:  °· Understand these instructions. °· Will watch your condition. °· Will get help right away if you are not doing well or get worse. °Document Released: 03/10/2008 Document Revised: 07/13/2013 Document Reviewed: 05/30/2013 °Exit[employer]® Patient Information ©2014 [employer]. ° °

## 2014-03-20 NOTE — ED Notes (Signed)
C/o sore throat which started yesterday OTC meds used as tx

## 2014-03-20 NOTE — ED Provider Notes (Signed)
CSN: [id number]     Arrival date & time 03/20/14  1240 History   First MD Initiated Contact with Patient 03/20/14 1409     Chief Complaint  Patient presents with  . Sore Throat   (Consider location/radiation/quality/duration/timing/severity/associated sxs/prior Treatment) HPI Comments: 38 year old female with a sore throat developing yesterday. It is associated with an occasional dry cough. No reported fevers.  Patient is a 38 y.o. female presenting with pharyngitis.  Sore Throat Pertinent negatives include no headaches and no shortness of breath.    Past Medical History  Diagnosis Date  . Migraines   . Nephrolithiasis   . Right knee pain    Past Surgical History  Procedure Laterality Date  . Tubal ligation    . Appendectomy  2000  . Cholecystectomy  2000  . Kidney stone surgery  2000  . Knee arthroscopy  07-11-11    right knee, per Dr. Milly JakobJohn Lee Graves   . Total hip arthroplasty  06-03-12    [facility]    Family History  Problem Relation Age of Onset  . Hypertension Mother    History  Substance Use Topics  . Smoking status: Never Smoker   . Smokeless tobacco: Never Used  . Alcohol Use: 0.6 oz/week    1 Glasses of wine per week   OB History   Grav Para Term Preterm Abortions TAB SAB Ect Mult Living   2 2 1 1      1      Review of Systems  Constitutional: Negative.  Negative for fever.  HENT: Positive for congestion, postnasal drip and sore throat.   Respiratory: Positive for cough. Negative for shortness of breath.   Gastrointestinal: Negative for nausea and vomiting.  Musculoskeletal: Negative.   Skin: Negative for pallor and rash.  Neurological: Negative for dizziness and headaches.  Hematological: Negative for adenopathy.  Psychiatric/Behavioral: Negative.     Allergies  Imitrex and Percocet  Home Medications   Prior to Admission medications   Medication Sig Start Date End Date Taking? Authorizing Provider   cyclobenzaprine (FLEXERIL) 10 MG tablet Take 10 mg by mouth 3 (three) times daily as needed for muscle spasms.    Historical Provider, MD  diphenhydrAMINE (SOMINEX) 25 MG tablet Take 25 mg by mouth at bedtime as needed for allergies or sleep.    Historical Provider, MD  Ibuprofen-Diphenhydramine Cit (ADVIL PM PO) Take 2 tablets by mouth at bedtime as needed (for sleep).    Historical Provider, MD  nystatin cream (MYCOSTATIN) Apply to affected area 2 times daily 03/18/13   Verita SchneidersEvelyn M Key, NP  Tetrahydrozoline HCl (VISINE OP) Apply 2 drops to eye 2 (two) times daily as needed (for dry eyes).    Historical Provider, MD  triamcinolone cream (KENALOG) 0.1 % Apply topically 2 (two) times daily. 03/18/13   Elta GuadeloupeEvelyn M Key, NP   BP 125/88  Pulse 73  Temp(Src) 97.9 F (36.6 C) (Oral)  Resp 18  SpO2 100%  LMP 03/10/2014 Physical Exam  Nursing note and vitals reviewed. Constitutional: She is oriented to person, place, and time. She appears well-developed and well-nourished. No distress.  HENT:  Bilateral TM's nl OP with minimal erythema. No swelling or exudates.  Eyes: Conjunctivae and EOM are normal.  Neck: Normal range of motion. Neck supple.  Cardiovascular: Normal rate, regular rhythm and normal heart sounds.   Pulmonary/Chest: Effort normal and breath sounds normal. No respiratory distress. She has no wheezes. She has no rales.  Musculoskeletal:  She exhibits no edema.  Lymphadenopathy:    She has no cervical adenopathy.  Neurological: She is alert and oriented to person, place, and time. She exhibits normal muscle tone.  Skin: Skin is warm and dry.  Psychiatric: She has a normal mood and affect.    ED Course  Procedures (including critical care time) Labs Review Labs Reviewed  POCT RAPID STREP A ([facility] URG CARE ONLY)    Imaging Review No results found.   MDM   1. Pharyngitis    Ibuprofen as needed cepacol loz Lots of water claritin or allegra for drainage.    Hayden Rasmussenavid Abriella Filkins,  NP 03/20/14 1423

## 2014-03-22 LAB — CULTURE, GROUP A STREP

## 2014-03-22 NOTE — ED Provider Notes (Signed)
Medical screening examination/treatment/procedure(s) were performed by non-physician practitioner and as supervising physician I was immediately available for consultation/collaboration.  Kit Mollett, M.D.  Keo Schirmer C Palmina Clodfelter, MD 03/22/14 1412 

## 2014-08-07 ENCOUNTER — Encounter (HOSPITAL_COMMUNITY): Payer: Self-pay | Admitting: Emergency Medicine

## 2014-11-06 ENCOUNTER — Other Ambulatory Visit: Payer: Self-pay | Admitting: Family Medicine

## 2014-11-06 ENCOUNTER — Other Ambulatory Visit (HOSPITAL_COMMUNITY)
Admission: RE | Admit: 2014-11-06 | Discharge: 2014-11-06 | Disposition: A | Discharge status: Home / Self Care | Payer: BLUE CROSS/BLUE SHIELD | Source: Ambulatory Visit | Attending: Family Medicine | Admitting: Family Medicine

## 2014-11-06 DIAGNOSIS — Z01419 Encounter for gynecological examination (general) (routine) without abnormal findings: Secondary | ICD-10-CM | POA: Insufficient documentation

## 2014-11-06 DIAGNOSIS — Z113 Encounter for screening for infections with a predominantly sexual mode of transmission: Secondary | ICD-10-CM | POA: Diagnosis present

## 2014-11-08 LAB — CYTOLOGY - PAP

## 2015-01-01 ENCOUNTER — Ambulatory Visit: Payer: Self-pay | Admitting: Surgery

## 2015-01-24 ENCOUNTER — Ambulatory Visit
Admit: 2015-01-24 | Disposition: A | Discharge status: Home / Self Care | Payer: Self-pay | Attending: Surgery | Admitting: Surgery

## 2015-01-30 ENCOUNTER — Other Ambulatory Visit: Payer: Self-pay | Admitting: Internal Medicine

## 2015-01-30 DIAGNOSIS — M79605 Pain in left leg: Secondary | ICD-10-CM

## 2015-01-30 DIAGNOSIS — M7989 Other specified soft tissue disorders: Secondary | ICD-10-CM

## 2015-01-31 ENCOUNTER — Ambulatory Visit
Admission: RE | Admit: 2015-01-31 | Discharge: 2015-01-31 | Disposition: A | Discharge status: Home / Self Care | Payer: BLUE CROSS/BLUE SHIELD | Source: Ambulatory Visit | Attending: Internal Medicine | Admitting: Internal Medicine

## 2015-01-31 DIAGNOSIS — M7989 Other specified soft tissue disorders: Secondary | ICD-10-CM

## 2015-01-31 DIAGNOSIS — M79605 Pain in left leg: Secondary | ICD-10-CM

## 2015-04-10 ENCOUNTER — Other Ambulatory Visit: Payer: Self-pay | Admitting: Obstetrics & Gynecology

## 2015-04-23 ENCOUNTER — Other Ambulatory Visit: Payer: Self-pay

## 2015-04-23 ENCOUNTER — Other Ambulatory Visit: Payer: Self-pay | Admitting: Physician Assistant

## 2015-04-23 DIAGNOSIS — M25562 Pain in left knee: Secondary | ICD-10-CM

## 2015-06-19 ENCOUNTER — Other Ambulatory Visit: Payer: Self-pay | Admitting: Surgery

## 2015-06-19 DIAGNOSIS — M87051 Idiopathic aseptic necrosis of right femur: Secondary | ICD-10-CM

## 2015-06-19 DIAGNOSIS — M87052 Idiopathic aseptic necrosis of left femur: Principal | ICD-10-CM

## 2015-07-02 ENCOUNTER — Other Ambulatory Visit: Payer: BLUE CROSS/BLUE SHIELD

## 2015-07-02 ENCOUNTER — Ambulatory Visit: Payer: BLUE CROSS/BLUE SHIELD

## 2015-10-04 ENCOUNTER — Emergency Department (HOSPITAL_COMMUNITY)
Admission: EM | Admit: 2015-10-04 | Discharge: 2015-10-04 | Disposition: A | Discharge status: Home / Self Care | Payer: BLUE CROSS/BLUE SHIELD | Source: Home / Self Care | Attending: Family Medicine | Admitting: Family Medicine

## 2015-10-04 ENCOUNTER — Encounter (HOSPITAL_COMMUNITY): Payer: Self-pay | Admitting: *Deleted

## 2015-10-04 DIAGNOSIS — S31831A Laceration without foreign body of anus, initial encounter: Secondary | ICD-10-CM | POA: Diagnosis not present

## 2015-10-04 MED ORDER — HYDROCORTISONE ACETATE 25 MG RE SUPP: 25.0000 mg | Freq: Two times a day (BID) | RECTAL | Status: DC

## 2015-10-04 NOTE — ED Provider Notes (Signed)
CSN: [id number]     Arrival date & time 10/04/15  1306 History   First MD Initiated Contact with Patient 10/04/15 1502     Chief Complaint  Patient presents with  . Rectal Bleeding   (Consider location/radiation/quality/duration/timing/severity/associated sxs/prior Treatment) Patient is a 39 y.o. female presenting with hematochezia. The history is provided by the patient.  Rectal Bleeding Quality:  Bright red Amount:  Scant Duration:  1 week Progression:  Unchanged Chronicity:  New Context: hemorrhoids   Context: not anal fissures, not constipation, not diarrhea and not rectal pain   Similar prior episodes: no   Relieved by:  Nothing Worsened by:  Nothing tried Ineffective treatments:  None tried Associated symptoms: no abdominal pain and no fever     Past Medical History  Diagnosis Date  . Migraines   . Nephrolithiasis   . Right knee pain    Past Surgical History  Procedure Laterality Date  . Tubal ligation    . Appendectomy  2000  . Cholecystectomy  2000  . Kidney stone surgery  2000  . Knee arthroscopy  07-11-11    right knee, per Dr. Milly Jakob   . Total hip arthroplasty  06-03-12    right hip, per [facility] Guerry [facility]in at [facility] History  Problem Relation Age of Onset  . Hypertension Mother    Social History  Substance Use Topics  . Smoking status: Never Smoker   . Smokeless tobacco: Never Used  . Alcohol Use: 0.6 oz/week    1 Glasses of wine per week   OB History    Gravida Para Term Preterm AB TAB SAB Ectopic Multiple Living   Review of Systems  Constitutional: Negative for fever.  Gastrointestinal: Positive for hematochezia and anal bleeding. Negative for abdominal pain, diarrhea, constipation and abdominal distention.  Genitourinary: Negative.   All other systems reviewed and are negative.   Allergies  Imitrex and Percocet  Home Medications   Prior to Admission medications   Medication Sig Start Date End Date  Taking? Authorizing Provider  cyclobenzaprine (FLEXERIL) 10 MG tablet Take 10 mg by mouth 3 (three) times daily as needed for muscle spasms.    Historical Provider, MD  diphenhydrAMINE (SOMINEX) 25 MG tablet Take 25 mg by mouth at bedtime as needed for allergies or sleep.    Historical Provider, MD  hydrocortisone (ANUSOL-HC) 25 MG suppository Place 1 suppository (25 mg total) rectally 2 (two) times daily. 10/04/15   Linna Hoff, MD  Ibuprofen-Diphenhydramine Cit (ADVIL PM PO) Take 2 tablets by mouth at bedtime as needed (for sleep).    Historical Provider, MD  medroxyPROGESTERone (PROVERA) 10 MG tablet TAKE TWO TABLETS BY MOUTH EVERY DAY 04/11/15   Tereso Newcomer, MD  nystatin cream (MYCOSTATIN) Apply to affected area 2 times daily 03/18/13   Verita Schneiders Key, NP  Tetrahydrozoline HCl (VISINE OP) Apply 2 drops to eye 2 (two) times daily as needed (for dry eyes).    Historical Provider, MD  triamcinolone cream (KENALOG) 0.1 % Apply topically 2 (two) times daily. 03/18/13   Elta Guadeloupe, NP   Meds Ordered and Administered this Visit  Medications - No data to display  BP 153/95 mmHg  Pulse 83  Temp(Src) 97.9 F (36.6 C) (Oral)  Resp 16  SpO2 98%  LMP 09/23/2015 No data found.   Physical Exam  Constitutional: She is oriented to person, place,  and time. She appears well-developed and well-nourished.  Abdominal: Soft. Bowel sounds are normal. There is no tenderness.  Genitourinary: Rectal exam shows no external hemorrhoid, no fissure, no mass and no tenderness.    Pelvic exam was performed with patient prone.  Neurological: She is alert and oriented to person, place, and time.  Skin: Skin is warm and dry.  Nursing note and vitals reviewed.   ED Course  Procedures (including critical care time)  Labs Review Labs Reviewed - No data to display  Imaging Review No results found.   Visual Acuity Review  Right Eye Distance:   Left Eye Distance:   Bilateral Distance:    Right Eye  Near:   Left Eye Near:    Bilateral Near:         MDM   1. Tear of anal skin, initial encounter        Linna HoffJames D Ariyel Jeangilles, MD 10/04/15 1521

## 2015-10-04 NOTE — ED Notes (Signed)
Pt  Reports  Symptoms  Of  Rectal bleeding    Since     Last  Week       Slight  Pain  Noted             Pt reports     She has  Been applying  Tucks  To  The  Affected  Area

## 2015-10-04 NOTE — Discharge Instructions (Signed)
Use medicine as prescribed and see gastroent if further problems.

## 2017-11-12 ENCOUNTER — Emergency Department (HOSPITAL_BASED_OUTPATIENT_CLINIC_OR_DEPARTMENT_OTHER): Payer: Worker's Compensation

## 2017-11-12 ENCOUNTER — Emergency Department (HOSPITAL_BASED_OUTPATIENT_CLINIC_OR_DEPARTMENT_OTHER)
Admission: EM | Admit: 2017-11-12 | Discharge: 2017-11-12 | Disposition: A | Discharge status: Home / Self Care | Payer: Worker's Compensation | Attending: Emergency Medicine | Admitting: Emergency Medicine

## 2017-11-12 ENCOUNTER — Encounter (HOSPITAL_BASED_OUTPATIENT_CLINIC_OR_DEPARTMENT_OTHER): Payer: Self-pay | Admitting: *Deleted

## 2017-11-12 ENCOUNTER — Other Ambulatory Visit: Payer: Self-pay

## 2017-11-12 DIAGNOSIS — Y929 Unspecified place or not applicable: Secondary | ICD-10-CM | POA: Diagnosis not present

## 2017-11-12 DIAGNOSIS — M79645 Pain in left finger(s): Secondary | ICD-10-CM | POA: Insufficient documentation

## 2017-11-12 DIAGNOSIS — Y9389 Activity, other specified: Secondary | ICD-10-CM | POA: Diagnosis not present

## 2017-11-12 DIAGNOSIS — Z96641 Presence of right artificial hip joint: Secondary | ICD-10-CM | POA: Insufficient documentation

## 2017-11-12 DIAGNOSIS — Y990 Civilian activity done for income or pay: Secondary | ICD-10-CM | POA: Diagnosis not present

## 2017-11-12 DIAGNOSIS — W230XXA Caught, crushed, jammed, or pinched between moving objects, initial encounter: Secondary | ICD-10-CM | POA: Diagnosis not present

## 2017-11-12 DIAGNOSIS — Z79899 Other long term (current) drug therapy: Secondary | ICD-10-CM | POA: Diagnosis not present

## 2017-11-12 NOTE — ED Triage Notes (Signed)
Left hand injury boxes fell on her left middle finger while at work.

## 2017-11-12 NOTE — Discharge Instructions (Signed)
Alternate 600 mg of ibuprofen and [phone] mg of Tylenol every 3 hours as needed for pain. Do not exceed 4000 mg of Tylenol daily.  Apply ice or heat for comfort.  Keep the splint on for comfort.  Follow-up with your primary care physician or sports medicine physician if symptoms persist.  Return to the emergency department if any concerning signs or symptoms develop such as fevers, severe swelling, numbness, or weakness.

## 2017-11-12 NOTE — ED Provider Notes (Signed)
[facility] EMERGENCY DEPARTMENT Provider Note   CSN: [id number] Arrival date & time: 11/12/17  1539     History   Chief Complaint Chief Complaint  Patient presents with  . Hand Injury    HPI Tanya Page is a 42 y.o. female presents today with chief complaint acute onset, intermittent left hand and left middle finger pain secondary to injury at work.  She states that at around 2 PM multiple heavy boxes fell and landed along the left side of her body.  She states that she flexed her left elbow in an attempt to brace herself and protect her head.  She denies head injury or loss of consciousness.  She states that primary impact was along the dorsum of the left hand especially overlying the left middle finger.  She endorses intermittent aching pain to the radial aspect of the left third proximal phalanx with radiation to the dorsum of the hand.  She denies numbness, tingling, or weakness.  She applied ice but states that this worsened the pain.  She is right-hand dominant.  The history is provided by the patient.    Past Medical History:  Diagnosis Date  . Migraines   . Nephrolithiasis   . Right knee pain     There are no active problems to display for this patient.   Past Surgical History:  Procedure Laterality Date  . APPENDECTOMY  2000  . CHOLECYSTECTOMY  2000  . KIDNEY STONE SURGERY  2000  . KNEE ARTHROSCOPY  07-11-11   right knee, per Dr. Milly JakobJohn Lee Graves   . TOTAL HIP ARTHROPLASTY  06-03-12   [facility]   . TUBAL LIGATION      OB History    Gravida Para Term Preterm AB Living   2 2 1 1   1    SAB TAB Ectopic Multiple Live Births                   Home Medications    Prior to Admission medications   Medication Sig Start Date End Date Taking? Authorizing Provider  cyclobenzaprine (FLEXERIL) 10 MG tablet Take 10 mg by mouth 3 (three) times daily as needed for muscle spasms.    [provider]  diphenhydrAMINE  (SOMINEX) 25 MG tablet Take 25 mg by mouth at bedtime as needed for allergies or sleep.    [provider]  hydrocortisone (ANUSOL-HC) 25 MG suppository Place 1 suppository (25 mg total) rectally 2 (two) times daily. 10/04/15   Linna HoffKindl, James D, MD  Ibuprofen-Diphenhydramine Cit (ADVIL PM PO) Take 2 tablets by mouth at bedtime as needed (for sleep).    [provider]  medroxyPROGESTERone (PROVERA) 10 MG tablet TAKE TWO TABLETS BY MOUTH EVERY DAY 04/11/15   Anyanwu, Jethro BastosUgonna A, MD  nystatin cream (MYCOSTATIN) Apply to affected area 2 times daily 03/18/13   Key, Verita SchneidersEvelyn M, NP  Tetrahydrozoline HCl (VISINE OP) Apply 2 drops to eye 2 (two) times daily as needed (for dry eyes).    [provider]  triamcinolone cream (KENALOG) 0.1 % Apply topically 2 (two) times daily. 03/18/13   KeyVerita Schneiders, Evelyn M, NP    Family History Family History  Problem Relation Age of Onset  . Hypertension Mother     Social History Social History   Tobacco Use  . Smoking status: Never Smoker  . Smokeless tobacco: Never Used  Substance Use Topics  . Alcohol use: Yes    Alcohol/week: 0.6  oz    Types: 1 Glasses of wine per week  . Drug use: No     Allergies   Imitrex [sumatriptan base] and Percocet [oxycodone-acetaminophen]   Review of Systems Review of Systems  Musculoskeletal: Positive for arthralgias.  Neurological: Negative for syncope, weakness, numbness and headaches.     Physical Exam Updated Vital Signs BP 132/83   Pulse 76   Temp 98.4 F (36.9 C) (Oral)   Resp 20   Ht 5\' 6"  (1.676 m)   Wt 86.2 kg (190 lb)   LMP 11/05/2017   SpO2 94%   BMI 30.67 kg/m   Physical Exam  Constitutional: She appears well-developed and well-nourished. No distress.  HENT:  Head: Normocephalic and atraumatic.  Eyes: Conjunctivae are normal. Right eye exhibits no discharge. Left eye exhibits no discharge.  Neck: No JVD present. No tracheal deviation present.  Cardiovascular: Normal rate and  intact distal pulses.  2+ radial pulses bilaterally  Pulmonary/Chest: Effort normal.  Abdominal: She exhibits no distension.  Musculoskeletal: She exhibits no edema.  Mildly decreased range of motion of the left third digit with flexion and extension secondary to pain.  Focally tender to palpation overlying the radial aspect of the left third proximal phalanx.  No deformity, crepitus, swelling, or erythema noted.  5/5 strength of BUE major muscle groups including wrist and digits with flexion and extension against resistance.  No snuffbox tenderness.  Neurological: She is alert.  Fluent speech, no facial droop, sensation intact to soft touch of bilateral upper extremities  Skin: Skin is warm and dry. No erythema.  Psychiatric: She has a normal mood and affect. Her behavior is normal.  Nursing note and vitals reviewed.    ED Treatments / Results  Labs (all labs ordered are listed, but only abnormal results are displayed) Labs Reviewed - No data to display  EKG  EKG Interpretation None       Radiology Dg Hand Complete Left  Result Date: 11/12/2017 CLINICAL DATA:  Crush injury to the left hand with middle finger pain and limited range of motion. EXAM: LEFT HAND - COMPLETE 3+ VIEW COMPARISON:  None. FINDINGS: There is no evidence of fracture or dislocation. There is no evidence of arthropathy or other focal bone abnormality. Soft tissues are unremarkable. IMPRESSION: Negative. Electronically Signed   By: Paulina Fusi M.D.   On: 11/12/2017 16:21    Procedures Procedures (including critical care time)  Medications Ordered in ED Medications - No data to display   Initial Impression / Assessment and Plan / ED Course  I have reviewed the triage vital signs and the nursing notes.  Pertinent labs & imaging results that were available during my care of the patient were reviewed by me and considered in my medical decision making (see chart for details).     Patient with left middle  finger pain radiating into her hand after injury earlier today.  Afebrile, vital signs are stable.  She is neurovascularly intact.  Radiographs reviewed by me show no acute fracture or dislocation.  No evidence of compartment syndrome, septic joint, or flexor tenosynovitis. RICE therapy indicated and discussed with patient. finger splint was applied.  Discussed indications for return to the ED.  She will follow-up with primary care physician or sports medicine if symptoms persist. Pt and patient's family verbalized understanding of and agreement with plan and patient is safe for discharge home at this time.   Final Clinical Impressions(s) / ED Diagnoses   Final diagnoses:  Pain of  left middle finger    ED Discharge Orders    None       Bennye Alm 11/12/17 1754    Benjiman Core, MD 11/13/17 [phone]

## 2018-04-28 ENCOUNTER — Other Ambulatory Visit: Payer: Self-pay | Admitting: Gastroenterology

## 2018-04-28 DIAGNOSIS — R1033 Periumbilical pain: Secondary | ICD-10-CM

## 2018-05-11 ENCOUNTER — Other Ambulatory Visit: Payer: Self-pay

## 2019-12-12 ENCOUNTER — Other Ambulatory Visit: Payer: Self-pay

## 2019-12-12 ENCOUNTER — Ambulatory Visit (HOSPITAL_COMMUNITY)
Admission: EM | Admit: 2019-12-12 | Discharge: 2019-12-12 | Disposition: A | Discharge status: Home / Self Care | Payer: Self-pay | Attending: Urgent Care | Admitting: Urgent Care

## 2019-12-12 ENCOUNTER — Encounter (HOSPITAL_COMMUNITY): Payer: Self-pay | Admitting: Emergency Medicine

## 2019-12-12 DIAGNOSIS — M79642 Pain in left hand: Secondary | ICD-10-CM

## 2019-12-12 DIAGNOSIS — T2220XA Burn of second degree of shoulder and upper limb, except wrist and hand, unspecified site, initial encounter: Secondary | ICD-10-CM

## 2019-12-12 DIAGNOSIS — T23122A Burn of first degree of single left finger (nail) except thumb, initial encounter: Secondary | ICD-10-CM

## 2019-12-12 DIAGNOSIS — M25532 Pain in left wrist: Secondary | ICD-10-CM

## 2019-12-12 MED ORDER — SILVER SULFADIAZINE 1 % EX CREA: 1.0000 "application " | TOPICAL_CREAM | Freq: Every day | CUTANEOUS | 0 refills | Status: AC

## 2019-12-12 MED ORDER — NAPROXEN 500 MG PO TABS: 500.0000 mg | ORAL_TABLET | Freq: Two times a day (BID) | ORAL | 0 refills | Status: AC

## 2019-12-12 NOTE — Discharge Instructions (Addendum)
Apply Silvadene cream to your burn 2-3 times daily. Use non-adherent dressing to cover the cream/wound. Secure with an Ace wrap.

## 2019-12-12 NOTE — ED Provider Notes (Signed)
[facility]   MRN: [id number] DOB: Nov 28, 1975  Subjective:   Tanya Page is a 44 y.o. female presenting for a burn of her left wrist and hand from using a crockpot last night.  Patient accidentally lifted the top/full and had steam that burned her wrist and fingers.  She went to work today and had worsening pain.  Has not taken any medications for this.  She reports seeing blisters on her wrist and feeling throbbing and pain in both her wrist and her fingers.  No current facility-administered medications for this encounter.  Current Outpatient Medications:  .  cyclobenzaprine (FLEXERIL) 10 MG tablet, Take 10 mg by mouth 3 (three) times daily as needed for muscle spasms., Disp: , Rfl:  .  diphenhydrAMINE (SOMINEX) 25 MG tablet, Take 25 mg by mouth at bedtime as needed for allergies or sleep., Disp: , Rfl:  .  hydrocortisone (ANUSOL-HC) 25 MG suppository, Place 1 suppository (25 mg total) rectally 2 (two) times daily. (Patient not taking: Reported on 12/12/2019), Disp: 12 suppository, Rfl: 0 .  Ibuprofen-Diphenhydramine Cit (ADVIL PM PO), Take 2 tablets by mouth at bedtime as needed (for sleep)., Disp: , Rfl:  .  medroxyPROGESTERone (PROVERA) 10 MG tablet, TAKE TWO TABLETS BY MOUTH EVERY DAY (Patient not taking: Reported on 12/12/2019), Disp: 30 tablet, Rfl: 0 .  nystatin cream (MYCOSTATIN), Apply to affected area 2 times daily (Patient not taking: Reported on 12/12/2019), Disp: 30 g, Rfl: 0 .  Tetrahydrozoline HCl (VISINE OP), Apply 2 drops to eye 2 (two) times daily as needed (for dry eyes)., Disp: , Rfl:  .  triamcinolone cream (KENALOG) 0.1 %, Apply topically 2 (two) times daily. (Patient not taking: Reported on 12/12/2019), Disp: 30 g, Rfl: 0   Allergies  Allergen Reactions  . Imitrex [Sumatriptan Base] Shortness Of Breath    chest tightness  . Percocet [Oxycodone-Acetaminophen] Other (See Comments)    Hallucinations    Past Medical History:  Diagnosis Date  . Migraines   .  Nephrolithiasis   . Right knee pain      Past Surgical History:  Procedure Laterality Date  . APPENDECTOMY  2000  . CHOLECYSTECTOMY  2000  . KIDNEY STONE SURGERY  2000  . KNEE ARTHROSCOPY  07-11-11   right knee, per Dr. Milly Jakob   . TOTAL HIP ARTHROPLASTY  06-03-12   right hip, per Dr. Guerry [facility]in at [facility]   . TUBAL LIGATION      Family History  Problem Relation Age of Onset  . Hypertension Mother     Social History   Tobacco Use  . Smoking status: Never Smoker  . Smokeless tobacco: Never Used  Substance Use Topics  . Alcohol use: Yes    Alcohol/week: 1.0 standard drinks    Types: 1 Glasses of wine per week  . Drug use: No    ROS   Objective:   Vitals: BP 131/87 (BP Location: Right Arm)   Pulse 80   Temp 98.3 F (36.8 C) (Oral)   Resp 18   SpO2 98%   Physical Exam Constitutional:      General: She is not in acute distress.    Appearance: Normal appearance. She is well-developed. She is not ill-appearing, toxic-appearing or diaphoretic.  HENT:     Head: Normocephalic and atraumatic.     Nose: Nose normal.     Mouth/Throat:     Mouth: Mucous membranes are moist.     Pharynx: Oropharynx is clear.  Eyes:  General: No scleral icterus.       Right eye: No discharge.        Left eye: No discharge.     Extraocular Movements: Extraocular movements intact.     Pupils: Pupils are equal, round, and reactive to light.  Cardiovascular:     Rate and Rhythm: Normal rate.  Pulmonary:     Effort: Pulmonary effort is normal.  Musculoskeletal:       Arms:  Skin:    General: Skin is warm and dry.  Neurological:     General: No focal deficit present.     Mental Status: She is alert and oriented to person, place, and time.  Psychiatric:        Mood and Affect: Mood normal.        Behavior: Behavior normal.        Thought Content: Thought content normal.        Judgment: Judgment normal.            Assessment and Plan :   1. Superficial  partial thickness burn of upper extremity   2. Left hand pain   3. Left wrist pain   4. Superficial burn of left index finger   5. Superficial burn of left middle finger     Applied Silvadene cream to left wrist, secured with nonadherent dressing and Ace wrap.  Counseled on wound care and appropriate application of Silvadene.  Use naproxen for pain inflammation. Counseled patient on potential for adverse effects with medications prescribed/recommended today, ER and return-to-clinic precautions discussed, patient verbalized understanding.    Jaynee Eagles, PA-C 12/12/19 1704

## 2019-12-12 NOTE — ED Triage Notes (Addendum)
Pt here for burn to left wrist and first two fingers after lifting foil from crock pot last night; redness noted to area

## 2020-01-04 ENCOUNTER — Ambulatory Visit (HOSPITAL_COMMUNITY)
Admission: EM | Admit: 2020-01-04 | Discharge: 2020-01-04 | Disposition: A | Discharge status: Home / Self Care | Payer: HRSA Program | Attending: Family Medicine | Admitting: Family Medicine

## 2020-01-04 ENCOUNTER — Encounter (HOSPITAL_COMMUNITY): Payer: Self-pay

## 2020-01-04 ENCOUNTER — Other Ambulatory Visit: Payer: Self-pay

## 2020-01-04 DIAGNOSIS — Z8249 Family history of ischemic heart disease and other diseases of the circulatory system: Secondary | ICD-10-CM | POA: Diagnosis not present

## 2020-01-04 DIAGNOSIS — R059 Cough, unspecified: Secondary | ICD-10-CM

## 2020-01-04 DIAGNOSIS — U071 COVID-19: Secondary | ICD-10-CM | POA: Diagnosis not present

## 2020-01-04 DIAGNOSIS — R05 Cough: Secondary | ICD-10-CM | POA: Diagnosis present

## 2020-01-04 DIAGNOSIS — Z888 Allergy status to other drugs, medicaments and biological substances status: Secondary | ICD-10-CM | POA: Diagnosis not present

## 2020-01-04 DIAGNOSIS — Z885 Allergy status to narcotic agent status: Secondary | ICD-10-CM | POA: Insufficient documentation

## 2020-01-04 MED ORDER — PREDNISONE 20 MG PO TABS: 20.0000 mg | ORAL_TABLET | Freq: Two times a day (BID) | ORAL | 0 refills | Status: AC

## 2020-01-04 MED ORDER — BENZONATATE 200 MG PO CAPS: 200.0000 mg | ORAL_CAPSULE | Freq: Two times a day (BID) | ORAL | 0 refills | Status: AC | PRN

## 2020-01-04 NOTE — ED Triage Notes (Signed)
Pt reports having cough and headache since last night.Pt states the symptosm are related to her allergies.

## 2020-01-04 NOTE — Discharge Instructions (Signed)
Take the prednisone 2 x a day Take 2 doses today Take the tessalon 2 x a day for cough Drink lots of water

## 2020-01-04 NOTE — ED Provider Notes (Signed)
[facility]    CSN: [id number] Arrival date & time: 01/04/20  0801      History   Chief Complaint Chief Complaint  Patient presents with  . Cough  . Headache    HPI Tanya Page is a 44 y.o. female.   HPI  Patient developed a cough last night.  She states that her around 10:00 at night.  She states she coughed all night.  She states that she has a headache and some chest discomfort, she thinks from coughing.  It hurts right in the center of her chest when she coughs or takes a deep breath.  She states that she is having a lot of postnasal drip.  She states that this is typical for her for allergies.  She states she gets allergies like this every year or 2.  She has allergy medicine over-the-counter that she takes.  She missed work because of the cough and needs a work note.  She has no fever chills.  No loss of smell or taste.  No exposure to illness/Covid  Past Medical History:  Diagnosis Date  . Migraines   . Nephrolithiasis   . Right knee pain     There are no problems to display for this patient.   Past Surgical History:  Procedure Laterality Date  . APPENDECTOMY  2000  . CHOLECYSTECTOMY  2000  . KIDNEY STONE SURGERY  2000  . KNEE ARTHROSCOPY  07-11-11   right knee, per Dr. Milly Jakob   . TOTAL HIP ARTHROPLASTY  06-03-12   right hip, per Dr. Guerry Bruin at [facility]   . TUBAL LIGATION      OB History    Gravida  2   Para  2   Term  1   Preterm  1   AB      Living  1     SAB      TAB      Ectopic      Multiple      Live Births               Home Medications    Prior to Admission medications   Medication Sig Start Date End Date Taking? Authorizing Provider  benzonatate (TESSALON) 200 MG capsule Take 1 capsule (200 mg total) by mouth 2 (two) times daily as needed for cough. 01/04/20   Eustace Moore, MD  cyclobenzaprine (FLEXERIL) 10 MG tablet Take 10 mg by mouth 3 (three) times daily as needed for muscle spasms.     [provider]  diphenhydrAMINE (SOMINEX) 25 MG tablet Take 25 mg by mouth at bedtime as needed for allergies or sleep.    [provider]  Ibuprofen-Diphenhydramine Cit (ADVIL PM PO) Take 2 tablets by mouth at bedtime as needed (for sleep).    [provider]  naproxen (NAPROSYN) 500 MG tablet Take 1 tablet (500 mg total) by mouth 2 (two) times daily. 12/12/19   Wallis Bamberg, PA-C  predniSONE (DELTASONE) 20 MG tablet Take 1 tablet (20 mg total) by mouth 2 (two) times daily with a meal. 01/04/20   Eustace Moore, MD  silver sulfADIAZINE (SILVADENE) 1 % cream Apply 1 application topically daily. 12/12/19   Wallis Bamberg, PA-C  Tetrahydrozoline HCl (VISINE OP) Apply 2 drops to eye 2 (two) times daily as needed (for dry eyes).    [provider]    Family History Family History  Problem Relation Age of Onset  . Hypertension Mother  Social History Social History   Tobacco Use  . Smoking status: Never Smoker  . Smokeless tobacco: Never Used  Substance Use Topics  . Alcohol use: Yes    Alcohol/week: 1.0 standard drinks    Types: 1 Glasses of wine per week  . Drug use: No     Allergies   Imitrex [sumatriptan base] and Percocet [oxycodone-acetaminophen]   Review of Systems Review of Systems  HENT: Positive for congestion, postnasal drip and rhinorrhea.   Respiratory: Positive for cough.   Cardiovascular: Positive for chest pain.  Psychiatric/Behavioral: Positive for sleep disturbance.     Physical Exam Triage Vital Signs ED Triage Vitals  Enc Vitals Group     BP 01/04/20 0822 127/84     Pulse Rate 01/04/20 0822 79     Resp 01/04/20 0822 (!) 22     Temp 01/04/20 0822 98.1 F (36.7 C)     Temp Source 01/04/20 0822 Oral     SpO2 01/04/20 0822 100 %     Weight --      Height --      Head Circumference --      Peak Flow --      Pain Score 01/04/20 0819 5     Pain Loc --      Pain Edu? --      Excl. in GC? --    No data found.   Updated Vital Signs BP 127/84 (BP Location: Right Arm)   Pulse 79   Temp 98.1 F (36.7 C) (Oral)   Resp (!) 22   LMP 11/14/2019   SpO2 100%     Physical Exam Constitutional:      General: She is not in acute distress.    Appearance: She is well-developed.  HENT:     Head: Normocephalic and atraumatic.     Nose: Congestion and rhinorrhea present.     Mouth/Throat:     Pharynx: No posterior oropharyngeal erythema.  Eyes:     Conjunctiva/sclera: Conjunctivae normal.     Pupils: Pupils are equal, round, and reactive to light.  Cardiovascular:     Rate and Rhythm: Normal rate.  Pulmonary:     Effort: Pulmonary effort is normal. No respiratory distress.     Breath sounds: Normal breath sounds. No wheezing, rhonchi or rales.  Chest:     Chest wall: Tenderness present.  Musculoskeletal:        General: Normal range of motion.     Cervical back: Normal range of motion.  Skin:    General: Skin is warm and dry.  Neurological:     General: No focal deficit present.     Mental Status: She is alert.  Psychiatric:        Mood and Affect: Mood normal.        Behavior: Behavior normal.      UC Treatments / Results  Labs (all labs ordered are listed, but only abnormal results are displayed) Labs Reviewed  SARS CORONAVIRUS 2 (TAT 6-24 HRS)    EKG   Radiology No results found.  Procedures Procedures (including critical care time)  Medications Ordered in UC Medications - No data to display  Initial Impression / Assessment and Plan / UC Course  I have reviewed the triage vital signs and the nursing notes.  Pertinent labs & imaging results that were available during my care of the patient were reviewed by me and considered in my medical decision making (see chart for details).  Discussed that this could be allergies or viral infection.  Okay to give her prednisone for 5 days, and Tessalon for her cough.  She can use Tylenol or ibuprofen for pain.  Push fluids.  Call  or return if not improving in a few days Final Clinical Impressions(s) / UC Diagnoses   Final diagnoses:  Cough     Discharge Instructions     Take the prednisone 2 x a day Take 2 doses today Take the tessalon 2 x a day for cough Drink lots of water   ED Prescriptions    Medication Sig Dispense Auth. Provider   predniSONE (DELTASONE) 20 MG tablet Take 1 tablet (20 mg total) by mouth 2 (two) times daily with a meal. 10 tablet Raylene Everts, MD   benzonatate (TESSALON) 200 MG capsule Take 1 capsule (200 mg total) by mouth 2 (two) times daily as needed for cough. 20 capsule Raylene Everts, MD     PDMP not reviewed this encounter.   Raylene Everts, MD 01/04/20 [phone]

## 2020-01-05 LAB — SARS CORONAVIRUS 2 (TAT 6-24 HRS): SARS Coronavirus 2: POSITIVE — AB

## 2020-01-18 ENCOUNTER — Ambulatory Visit: Payer: Self-pay | Attending: Internal Medicine

## 2020-01-18 DIAGNOSIS — Z20822 Contact with and (suspected) exposure to covid-19: Secondary | ICD-10-CM | POA: Insufficient documentation

## 2020-01-20 LAB — NOVEL CORONAVIRUS, NAA: SARS-CoV-2, NAA: NOT DETECTED

## 2020-01-20 LAB — SARS-COV-2, NAA 2 DAY TAT

## 2020-03-02 ENCOUNTER — Emergency Department (HOSPITAL_BASED_OUTPATIENT_CLINIC_OR_DEPARTMENT_OTHER): Payer: Self-pay

## 2020-03-02 ENCOUNTER — Emergency Department (HOSPITAL_BASED_OUTPATIENT_CLINIC_OR_DEPARTMENT_OTHER)
Admission: EM | Admit: 2020-03-02 | Discharge: 2020-03-02 | Disposition: A | Discharge status: Home / Self Care | Payer: Self-pay | Attending: Emergency Medicine | Admitting: Emergency Medicine

## 2020-03-02 ENCOUNTER — Other Ambulatory Visit: Payer: Self-pay

## 2020-03-02 ENCOUNTER — Encounter (HOSPITAL_BASED_OUTPATIENT_CLINIC_OR_DEPARTMENT_OTHER): Payer: Self-pay | Admitting: *Deleted

## 2020-03-02 DIAGNOSIS — R600 Localized edema: Secondary | ICD-10-CM | POA: Insufficient documentation

## 2020-03-02 DIAGNOSIS — Z8616 Personal history of COVID-19: Secondary | ICD-10-CM | POA: Insufficient documentation

## 2020-03-02 DIAGNOSIS — Z79899 Other long term (current) drug therapy: Secondary | ICD-10-CM | POA: Insufficient documentation

## 2020-03-02 DIAGNOSIS — M79606 Pain in leg, unspecified: Secondary | ICD-10-CM

## 2020-03-02 DIAGNOSIS — M5441 Lumbago with sciatica, right side: Secondary | ICD-10-CM | POA: Insufficient documentation

## 2020-03-02 DIAGNOSIS — M5442 Lumbago with sciatica, left side: Secondary | ICD-10-CM | POA: Insufficient documentation

## 2020-03-02 LAB — URINALYSIS, ROUTINE W REFLEX MICROSCOPIC
Bilirubin Urine: NEGATIVE
Glucose, UA: NEGATIVE mg/dL
Ketones, ur: NEGATIVE mg/dL
Nitrite: NEGATIVE
Protein, ur: NEGATIVE mg/dL
Specific Gravity, Urine: 1.025 (ref 1.005–1.030)
pH: 5.5 (ref 5.0–8.0)

## 2020-03-02 LAB — URINALYSIS, MICROSCOPIC (REFLEX)

## 2020-03-02 NOTE — ED Provider Notes (Signed)
[facility] EMERGENCY DEPARTMENT Provider Note   CSN: [id number] Arrival date & time: 03/02/20  1211     History Chief Complaint  Patient presents with  . Leg Pain    Tanya Page is a 44 y.o. female.  Patient with history of Covid diagnosis approximately 8 weeks ago presents the emergency department with complaint of back pain and burning pain that extends down into her bilateral legs.  This has been associated time with intermittent swelling of her feet.  Pain shoots down the back of her legs and is burning in nature.  She does not really have the burning sensation in the front of the legs.  She has had no associated weakness or difficulty walking.  She has not had any urinary retention or urinary incontinence but does report increased frequency at times.  She denies any fecal incontinence.  She denies neck pain or persistent numbness or tingling in her upper extremities.  She denies chest pain or fever.  From a breathing standpoint, she has been recovering slowly from coronavirus.  She has been on prednisone for this.  The back pain started about 2 weeks ago.  Patient has been taking gabapentin and tizanidine which has not really helped a whole lot.  Patient denies warning symptoms of back pain including: fecal incontinence, urinary retention or overflow incontinence, night sweats, unexplained fevers or weight loss, h/o cancer, IVDU, recent trauma.  She is trying to elevate her legs without improvement as well.         Past Medical History:  Diagnosis Date  . Migraines   . Nephrolithiasis   . Right knee pain     There are no problems to display for this patient.   Past Surgical History:  Procedure Laterality Date  . APPENDECTOMY  2000  . CHOLECYSTECTOMY  2000  . KIDNEY STONE SURGERY  2000  . KNEE ARTHROSCOPY  07-11-11   right knee, per Dr. Milly Jakob   . TOTAL HIP ARTHROPLASTY  06-03-12   right hip, per Dr. Guerry Bruin at [facility]   . TUBAL LIGATION        OB History    Gravida  2   Para  2   Term  1   Preterm  1   AB      Living  1     SAB      TAB      Ectopic      Multiple      Live Births              Family History  Problem Relation Age of Onset  . Hypertension Mother     Social History   Tobacco Use  . Smoking status: Never Smoker  . Smokeless tobacco: Never Used  Substance Use Topics  . Alcohol use: Yes    Alcohol/week: 1.0 standard drinks    Types: 1 Glasses of wine per week  . Drug use: No    Home Medications Prior to Admission medications   Medication Sig Start Date End Date Taking? Authorizing Provider  benzonatate (TESSALON) 200 MG capsule Take 1 capsule (200 mg total) by mouth 2 (two) times daily as needed for cough. 01/04/20   Eustace Moore, MD  cyclobenzaprine (FLEXERIL) 10 MG tablet Take 10 mg by mouth 3 (three) times daily as needed for muscle spasms.    [provider]  diphenhydrAMINE (SOMINEX) 25 MG tablet Take 25 mg by mouth at bedtime as needed for allergies  or sleep.    [provider]  Ibuprofen-Diphenhydramine Cit (ADVIL PM PO) Take 2 tablets by mouth at bedtime as needed (for sleep).    [provider]  naproxen (NAPROSYN) 500 MG tablet Take 1 tablet (500 mg total) by mouth 2 (two) times daily. 12/12/19   Jaynee Eagles, PA-C  predniSONE (DELTASONE) 20 MG tablet Take 1 tablet (20 mg total) by mouth 2 (two) times daily with a meal. 01/04/20   Raylene Everts, MD  silver sulfADIAZINE (SILVADENE) 1 % cream Apply 1 application topically daily. 12/12/19   Jaynee Eagles, PA-C  Tetrahydrozoline HCl (VISINE OP) Apply 2 drops to eye 2 (two) times daily as needed (for dry eyes).    [provider]    Allergies    Imitrex [sumatriptan base] and Percocet [oxycodone-acetaminophen]  Review of Systems   Review of Systems  Constitutional: Negative for fever and unexpected weight change.  HENT: Negative for rhinorrhea and sore throat.   Eyes: Negative for  redness.  Respiratory: Positive for shortness of breath (Ongoing since coronavirus diagnosis). Negative for cough.   Cardiovascular: Positive for leg swelling (Intermittent). Negative for chest pain.  Gastrointestinal: Negative for abdominal pain, constipation, diarrhea, nausea and vomiting.       Negative for fecal incontinence.   Genitourinary: Negative for dysuria, flank pain, hematuria, pelvic pain, vaginal bleeding and vaginal discharge.       Negative for urinary incontinence or retention.  Musculoskeletal: Positive for back pain. Negative for myalgias.  Skin: Negative for rash.  Neurological: Positive for numbness. Negative for weakness and headaches.       Denies saddle paresthesias.    Physical Exam Updated Vital Signs BP 121/86 (BP Location: Right Arm)   Pulse 79   Temp 98.5 F (36.9 C) (Oral)   Resp 18   Ht 5\' 6"  (1.676 m)   Wt 86.2 kg   LMP 03/02/2020   SpO2 100%   BMI 30.67 kg/m   Physical Exam Vitals and nursing note reviewed.  Constitutional:      Appearance: She is well-developed.  HENT:     Head: Normocephalic and atraumatic.  Eyes:     Conjunctiva/sclera: Conjunctivae normal.  Pulmonary:     Effort: Pulmonary effort is normal.  Abdominal:     Palpations: Abdomen is soft.     Tenderness: There is no abdominal tenderness.  Musculoskeletal:        General: Tenderness (Generalized tenderness bilaterally of the lower extremities.) present. No swelling. Normal range of motion.     Cervical back: Normal range of motion and neck supple. No tenderness. Normal range of motion.     Thoracic back: No tenderness. Normal range of motion.     Lumbar back: Spasms and tenderness present. No bony tenderness. Normal range of motion.     Right lower leg: No edema.     Left lower leg: No edema.     Comments: No step-off noted with palpation of spine.   Skin:    General: Skin is warm and dry.     Findings: No rash.  Neurological:     Mental Status: She is alert.      Sensory: No sensory deficit.     Deep Tendon Reflexes: Reflexes are normal and symmetric.     Comments: 5/5 strength in entire lower extremities bilaterally. No sensation deficit --does state she has burning in the back of her legs bilaterally.     ED Results / Procedures / Treatments   Labs (all  labs ordered are listed, but only abnormal results are displayed) Labs Reviewed  URINALYSIS, ROUTINE W REFLEX MICROSCOPIC - Abnormal; Notable for the following components:      Result Value   APPearance CLOUDY (*)    Hgb urine dipstick MODERATE (*)    Leukocytes,Ua MODERATE (*)    All other components within normal limits  URINALYSIS, MICROSCOPIC (REFLEX) - Abnormal; Notable for the following components:   Bacteria, UA MANY (*)    All other components within normal limits    EKG None  Radiology US Venous Img Lower Bilateral (DVT)  Result Date: 03/02/2020 CLINICAL DATA:  Bilateral lower extremity pain and edema EXAM: BILATERAL LOWER EXTREMITY VENOUS DUPLEX ULTRASOUND TECHNIQUE: Gray-scale sonography with graded compression, as well as color Doppler and duplex ultrasound were performed to evaluate the lower extremity deep venous systems from the level of the common femoral vein and including the common femoral, femoral, profunda femoral, popliteal and calf veins including the posterior tibial, peroneal and gastrocnemius veins when visible. The superficial great saphenous vein was also interrogated. Spectral Doppler was utilized to evaluate flow at rest and with distal augmentation maneuvers in the common femoral, femoral and popliteal veins. COMPARISON:  None. Left lower extremity duplex ultrasound January 31, 2015 FINDINGS: RIGHT LOWER EXTREMITY Common Femoral Vein: No evidence of thrombus. Normal compressibility, respiratory phasicity and response to augmentation. Saphenofemoral Junction: No evidence of thrombus. Normal compressibility and flow on color Doppler imaging. Profunda Femoral Vein: No  evidence of thrombus. Normal compressibility and flow on color Doppler imaging. Femoral Vein: No evidence of thrombus. Normal compressibility, respiratory phasicity and response to augmentation. Popliteal Vein: No evidence of thrombus. Normal compressibility, respiratory phasicity and response to augmentation. Calf Veins: No evidence of thrombus. Normal compressibility and flow on color Doppler imaging. Superficial Great Saphenous Vein: No evidence of thrombus. Normal compressibility. Venous Reflux:  None. Other Findings:  None. LEFT LOWER EXTREMITY Common Femoral Vein: No evidence of thrombus. Normal compressibility, respiratory phasicity and response to augmentation. Saphenofemoral Junction: No evidence of thrombus. Normal compressibility and flow on color Doppler imaging. Profunda Femoral Vein: No evidence of thrombus. Normal compressibility and flow on color Doppler imaging. Femoral Vein: No evidence of thrombus. Normal compressibility, respiratory phasicity and response to augmentation. Popliteal Vein: No evidence of thrombus. Normal compressibility, respiratory phasicity and response to augmentation. Calf Veins: No evidence of thrombus. Normal compressibility and flow on color Doppler imaging. Superficial Great Saphenous Vein: No evidence of thrombus. Normal compressibility. Venous Reflux:  None. Other Findings:  None. IMPRESSION: No evidence of deep venous thrombosis in either lower extremity. Electronically Signed   By: Bretta Bang III M.D.   On: 03/02/2020 16:00    Procedures Procedures (including critical care time)  Medications Ordered in ED Medications - No data to display  ED Course  I have reviewed the triage vital signs and the nursing notes.  Pertinent labs & imaging results that were available during my care of the patient were reviewed by me and considered in my medical decision making (see chart for details).  Patient seen and examined. Will obtain LE doppler US and UA.   Patient also makes mention of her doctor recommending a CT scan of her chest to evaluate for blood clots in the lungs.  I discussed I do not feel that this will be necessary unless she is found to have DVTs.  Patient denies any chest pain or shortness of breath.  Her oxygen saturation is 100% with a heart rate in the 80s.  Given her exam and reported symptoms, I have very low suspicion for pulmonary embolism.  We also did discuss that she may require imaging of her spine in the future if her symptoms persist.  Discussed that she does not have any red flags today which would necessitate transfer for emergent MRI.  Patient is in agreement.  Vital signs reviewed and are as follows: BP 121/86 (BP Location: Right Arm)   Pulse 79   Temp 98.5 F (36.9 C) (Oral)   Resp 18   Ht 5\' 6"  (1.676 m)   Wt 86.2 kg   LMP 03/02/2020   SpO2 100%   BMI 30.67 kg/m   4:27 PM bilateral lower extremity Dopplers were negative for DVT.  UA was contaminated; patient is currently on her menstrual period.  Low concern for UTI.  Discussed these results with the patient at bedside.  Discussed that she may require further evaluation of her lower back and overall symptoms, however this may be safely done by her primary care provider.  We discussed signs and symptoms to return.    Urged to return with worsening severe pain, loss of bowel or bladder control, trouble walking.   The patient verbalizes understanding and agrees with the plan.     MDM Rules/Calculators/A&P                      Patient with back pain with burning pain into her bilateral legs.  This has been ongoing over the past 2 weeks.  No injury.  No red flags.  Patient continues to recover from Covid infection diagnosed 8 weeks ago.  No DVT found on evaluation here.  Patient is ambulatory. No warning symptoms of back pain including: fecal incontinence, urinary retention or overflow incontinence, night sweats, waking from sleep with back pain, unexplained  fevers or weight loss, h/o cancer, IVDU, recent trauma. No concern for cauda equina, epidural abscess, or other serious cause of back pain. Conservative measures such as rest, ice/heat and pain medicine indicated with PCP follow-up if no improvement with conservative management.    Final Clinical Impression(s) / ED Diagnoses Final diagnoses:  Acute bilateral low back pain with bilateral sciatica    Rx / DC Orders ED Discharge Orders    None       03/04/2020, PA-C 03/02/20 1629    03/04/20, MD 03/03/20 [phone]

## 2020-03-02 NOTE — Discharge Instructions (Signed)
Please read and follow all provided instructions.  Your diagnoses today include:  1. Acute bilateral low back pain with bilateral sciatica   2. Pain and swelling of lower extremity, unspecified laterality     Tests performed today include:  Vital signs - see below for your results today  Ultrasound of your legs - no signs of blood clot  Urine test - was contaminated  Medications prescribed:   None  Take any prescribed medications only as directed.  Home care instructions:   Follow any educational materials contained in this packet  Please rest, use ice or heat on your back for the next several days  Do not lift, push, pull anything more than 10 pounds for the next week  Follow-up instructions: Please follow-up with your primary care provider in the next 1 week for further evaluation of your symptoms.  If you continue to have burning pain in her legs, you may need further evaluation of your back.  You do not have any emergent reasons to be transferred to the hospital for MRI today.  Return instructions:  SEEK IMMEDIATE MEDICAL ATTENTION IF YOU HAVE:  New numbness, tingling, weakness, or problem with the use of your arms or legs  Severe back pain not relieved with medications  Loss control of your bowels or bladder  Increasing pain in any areas of the body (such as chest or abdominal pain)  Shortness of breath, dizziness, or fainting.   Worsening nausea (feeling sick to your stomach), vomiting, fever, or sweats  Any other emergent concerns regarding your health   Additional Information:  Your vital signs today were: BP 121/86 (BP Location: Right Arm)   Pulse 79   Temp 98.5 F (36.9 C) (Oral)   Resp 18   Ht 5\' 6"  (1.676 m)   Wt 86.2 kg   LMP 03/02/2020   SpO2 100%   BMI 30.67 kg/m  If your blood pressure (BP) was elevated above 135/85 this visit, please have this repeated by your doctor within one month. --------------

## 2020-03-02 NOTE — ED Notes (Signed)
Burning in back xover a week and legs started started burning 1 week ago  States may be due to covid

## 2020-03-02 NOTE — ED Notes (Signed)
Pt transported to US

## 2020-03-02 NOTE — ED Triage Notes (Signed)
Back pain with burning in both legs for a week. She has a hx of Covid positive April 1st.

## 2020-07-08 IMAGING — US US EXTREM LOW VENOUS
1 series · 13 of 24 positions shown · non-contrast
Comparison: None.

Left lower extremity duplex ultrasound January 31, 2015

CLINICAL DATA: Bilateral lower extremity pain and edema

EXAM:
BILATERAL LOWER EXTREMITY VENOUS DUPLEX ULTRASOUND
TECHNIQUE: Gray-scale sonography with graded compression, as well as color
Doppler and duplex ultrasound were performed to evaluate the lower
extremity deep venous systems from the level of the common femoral
vein and including the common femoral, femoral, profunda femoral,
popliteal and calf veins including the posterior tibial, peroneal
and gastrocnemius veins when visible. The superficial great
saphenous vein was also interrogated. Spectral Doppler was utilized
to evaluate flow at rest and with distal augmentation maneuvers in
the common femoral, femoral and popliteal veins.

[Series 1: us extrem low venous · 13 of 56 slices shown]
[im 1/56]
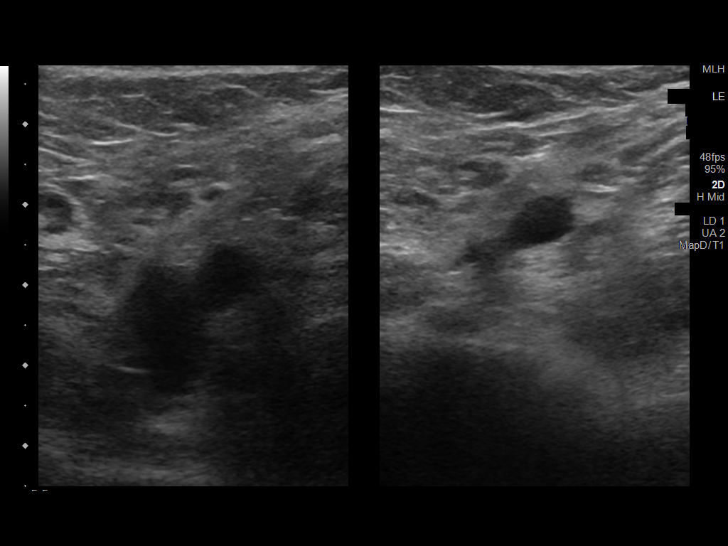
[im 5/56]
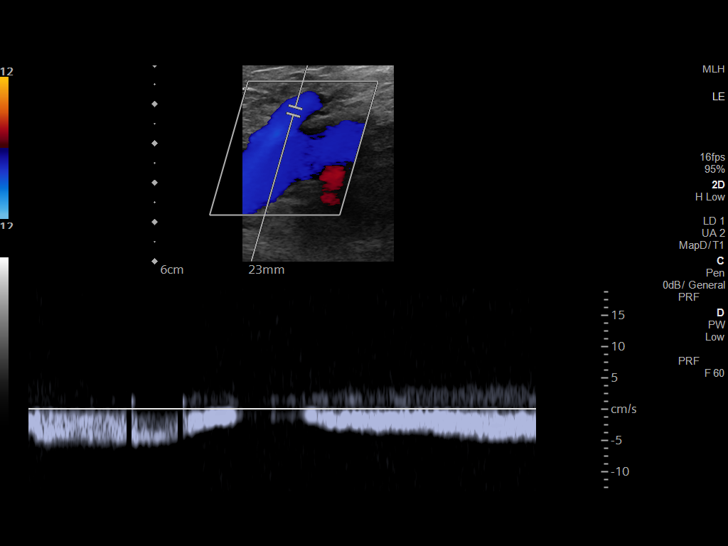
[im 10/56]
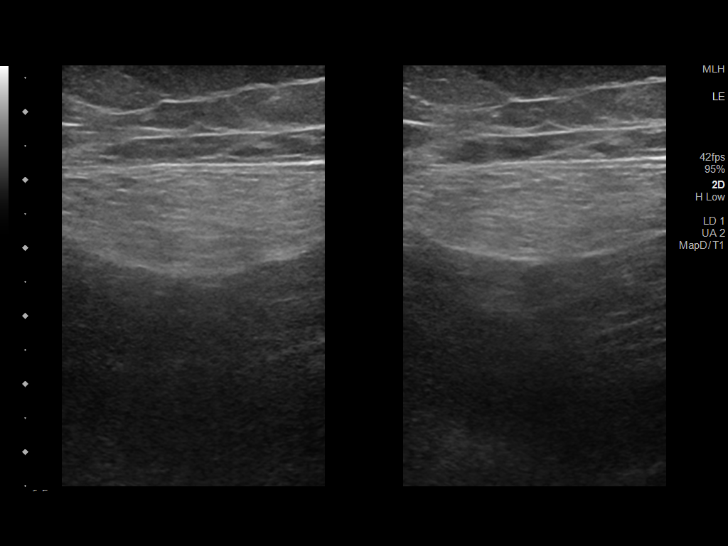
[im 15/56]
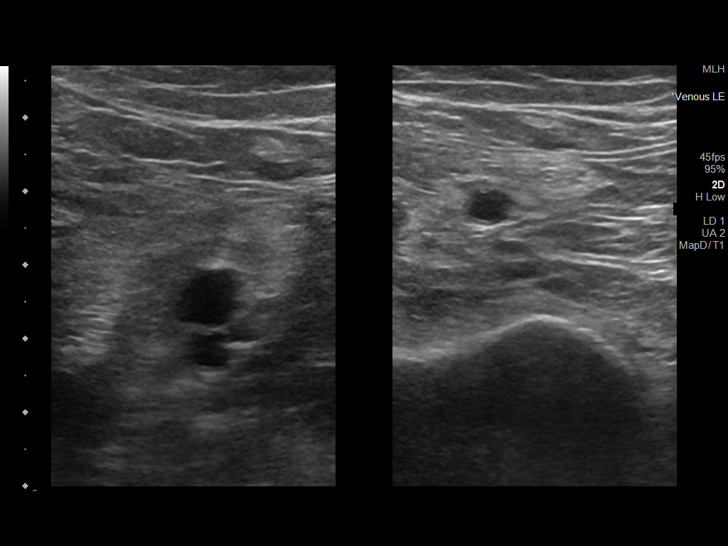
[im 20/56]
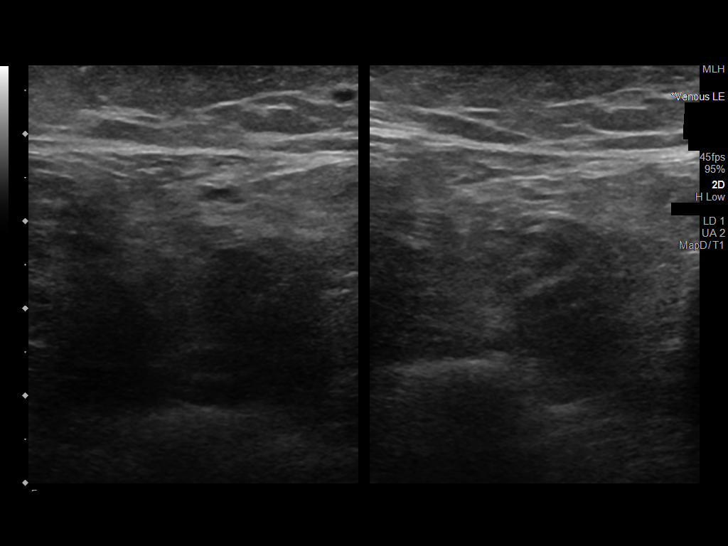
[im 24/56]
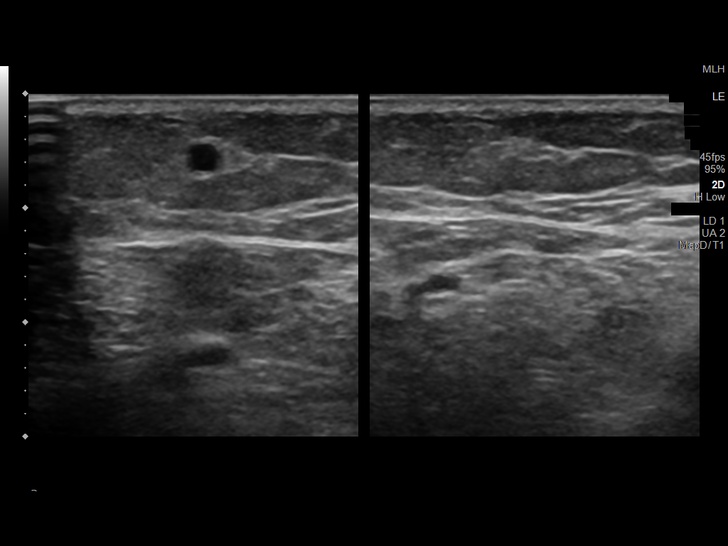
[im 29/56]
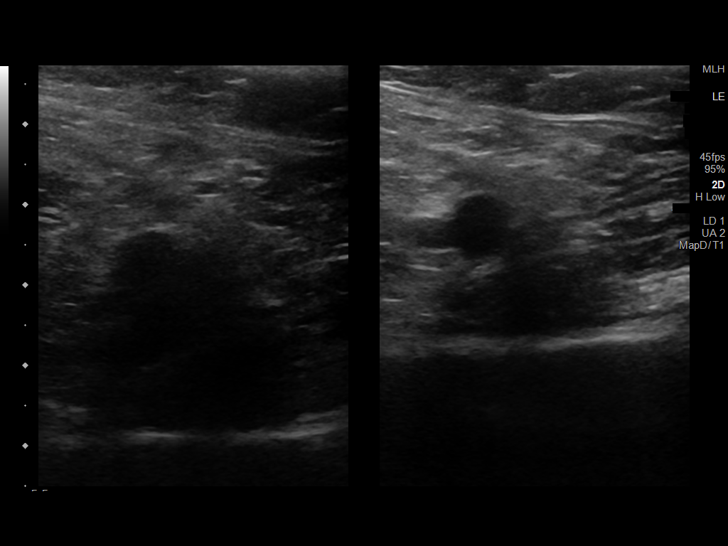
[im 32/56]
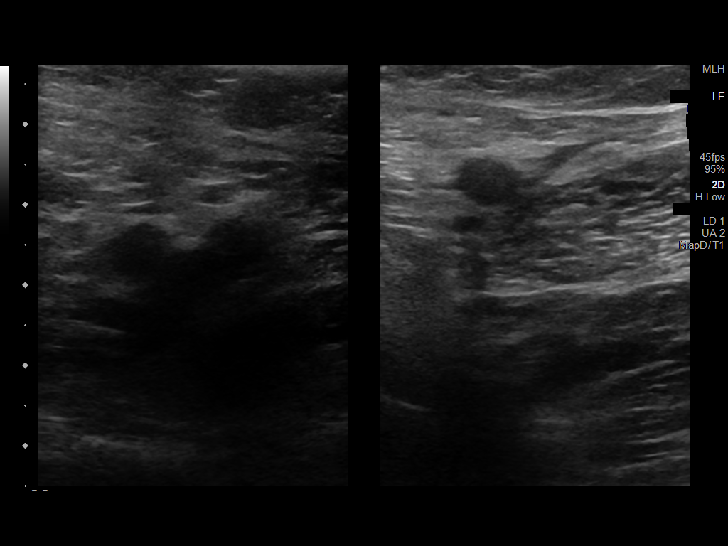
[im 36/56]
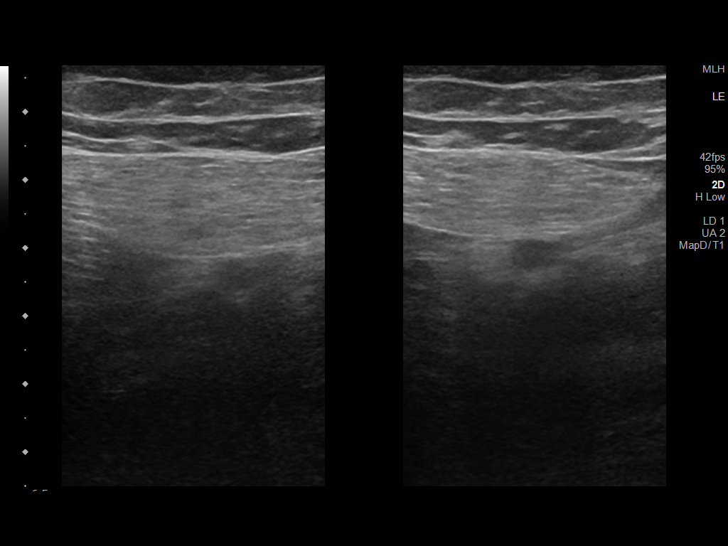
[im 41/56]
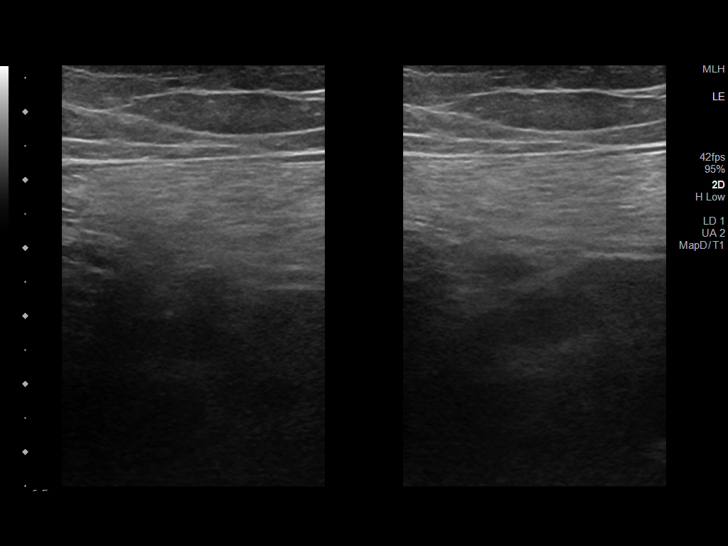
[im 46/56]
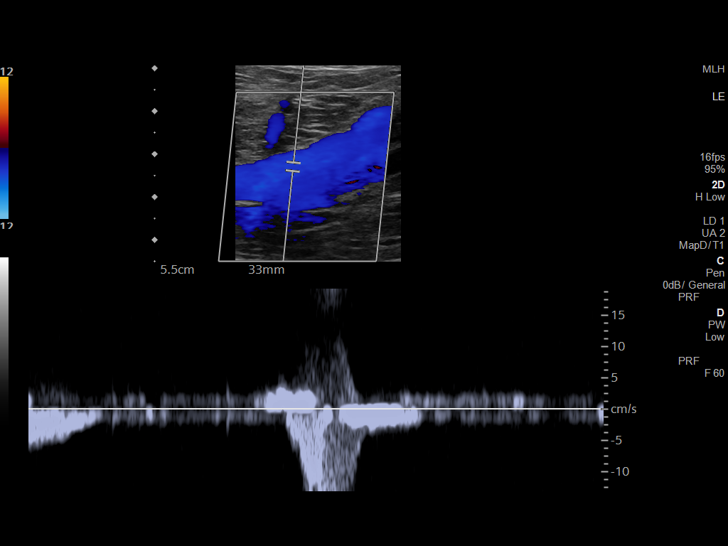
[im 51/56]
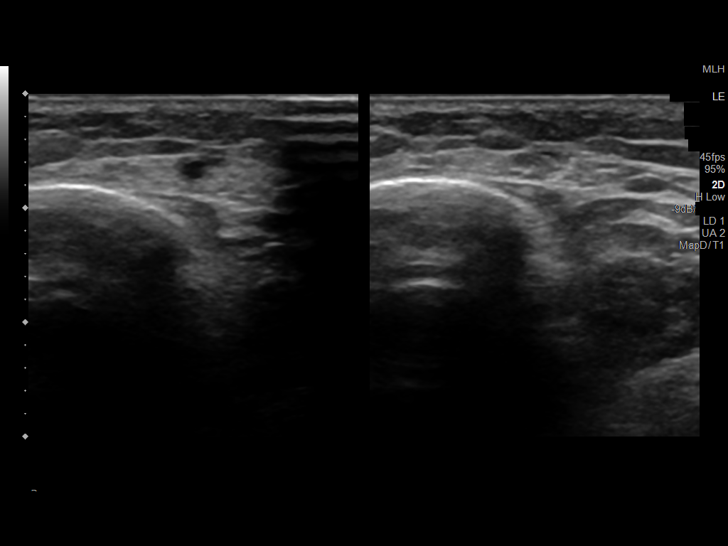
[im 56/56]
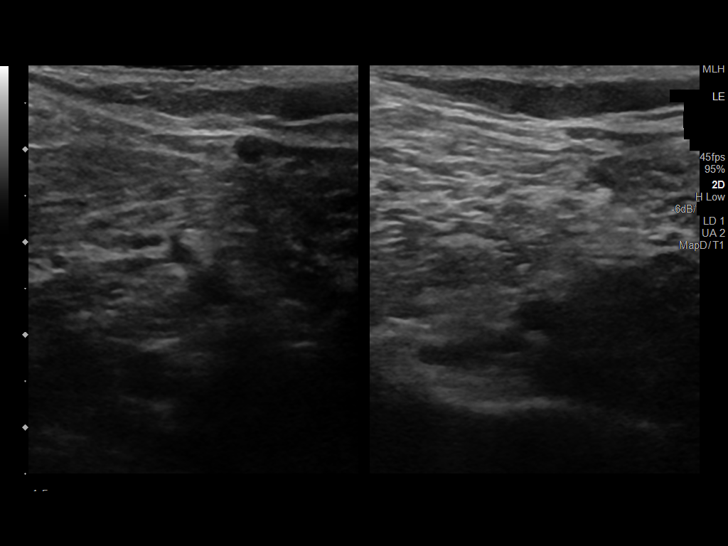

[13 of 24 positions shown; findings below may reference images not displayed]

FINDINGS: RIGHT LOWER EXTREMITY

Common Femoral Vein: No evidence of thrombus. Normal
compressibility, respiratory phasicity and response to augmentation.

Saphenofemoral Junction: No evidence of thrombus. Normal
compressibility and flow on color Doppler imaging.

Profunda Femoral Vein: No evidence of thrombus. Normal
compressibility and flow on color Doppler imaging.

Femoral Vein: No evidence of thrombus. Normal compressibility,
respiratory phasicity and response to augmentation.

Popliteal Vein: No evidence of thrombus. Normal compressibility,
respiratory phasicity and response to augmentation.

Calf Veins: No evidence of thrombus. Normal compressibility and flow
on color Doppler imaging.

Superficial Great Saphenous Vein: No evidence of thrombus. Normal
compressibility.

Venous Reflux:  None.

Other Findings:  None.

LEFT LOWER EXTREMITY

Common Femoral Vein: No evidence of thrombus. Normal
compressibility, respiratory phasicity and response to augmentation.

Saphenofemoral Junction: No evidence of thrombus. Normal
compressibility and flow on color Doppler imaging.

Profunda Femoral Vein: No evidence of thrombus. Normal
compressibility and flow on color Doppler imaging.

Femoral Vein: No evidence of thrombus. Normal compressibility,
respiratory phasicity and response to augmentation.

Popliteal Vein: No evidence of thrombus. Normal compressibility,
respiratory phasicity and response to augmentation.

Calf Veins: No evidence of thrombus. Normal compressibility and flow
on color Doppler imaging.

Superficial Great Saphenous Vein: No evidence of thrombus. Normal
compressibility.

Venous Reflux:  None.

Other Findings:  None.
IMPRESSION: No evidence of deep venous thrombosis in either lower extremity.

## 2022-05-09 ENCOUNTER — Other Ambulatory Visit: Payer: Self-pay | Admitting: Physician Assistant

## 2022-05-09 DIAGNOSIS — Z1231 Encounter for screening mammogram for malignant neoplasm of breast: Secondary | ICD-10-CM

## 2023-01-21 ENCOUNTER — Other Ambulatory Visit: Payer: Self-pay | Admitting: Physician Assistant

## 2023-01-21 DIAGNOSIS — Z1231 Encounter for screening mammogram for malignant neoplasm of breast: Secondary | ICD-10-CM

## 2023-02-05 ENCOUNTER — Ambulatory Visit
Admission: RE | Admit: 2023-02-05 | Discharge: 2023-02-05 | Disposition: A | Discharge status: Home / Self Care | Payer: Medicaid Other | Source: Ambulatory Visit | Attending: Physician Assistant | Admitting: Physician Assistant

## 2023-02-05 DIAGNOSIS — Z1231 Encounter for screening mammogram for malignant neoplasm of breast: Secondary | ICD-10-CM

## 2023-04-14 LAB — COLOGUARD: COLOGUARD: NEGATIVE
